# Patient Record
Sex: Male | Born: 1960 | Race: White | Hispanic: No | Marital: Married
Health system: Midwestern US, Academic
[De-identification: ages and names within clinical notes are randomized; demographics above are authoritative.]

---

## 2016-12-16 ENCOUNTER — Ambulatory Visit: Admit: 2016-12-16 | Discharge: 2016-12-17 | Payer: Commercial Managed Care - PPO

## 2016-12-16 ENCOUNTER — Encounter: Admit: 2016-12-16 | Discharge: 2016-12-16 | Payer: 59

## 2016-12-16 DIAGNOSIS — G629 Polyneuropathy, unspecified: ICD-10-CM

## 2016-12-16 DIAGNOSIS — I2510 Atherosclerotic heart disease of native coronary artery without angina pectoris: ICD-10-CM

## 2016-12-16 DIAGNOSIS — E785 Hyperlipidemia, unspecified: ICD-10-CM

## 2016-12-16 DIAGNOSIS — I10 Essential (primary) hypertension: Principal | ICD-10-CM

## 2016-12-16 MED ORDER — PREGABALIN 25 MG PO CAP
125 mg | ORAL_CAPSULE | Freq: Two times a day (BID) | ORAL | 5 refills | Status: AC
Start: 2016-12-16 — End: 2017-04-26

## 2016-12-16 NOTE — Progress Notes
Subjective:       History of Present Illness    INTERVAL HISTORY SINCE LAST VISIT:     Ricky Cordova 56 y.o. male is here today for follow up.      Onset: 10 years ago with numbness in the right thigh when walking on concrete then began to have pain in the feet.  This is bilateral.  Noted to be cold, tingling, burning sensation.  Pain is moderate.  Caffeine is a trigger for worsening pain. He thinks that the lyrica is still overall helpful. He does not feel that things have changed much. He can skip one dose and do ok, but if he skips an entire dose, the pain will be worse the next morning.   His back pain waxes and wanes.   He does report normal blood work with PCP recently.   FH: Maternal GF - diet controlled DM; Maternal GM - DM  Hx of DM: denies.   Hx of EtOH abuse: Occasional now. He notes when in his 77s, he drank heavily every night (10 years).  Hx of cancer/chemotherapy: denies      Medications tried:   lyrica 150mg  BID --> 75 mg twice daily 01/14/16, then back to 150mg /75mg  12/17  Cymbalta 30 mg daily --> constipation that was severe - and pain worsened  gabapentin (no help)  could not afford compounding cream  lidoderm patches not approved  EMLA cream -no help      Labs 2015: CBC, CMP, B12, folate, TSH, A1c, ANA, ESR, Mag - normal, will be scanned; MG panel neg, IFE neg, Lyme ab neg  EMG/NCS 09/26/13 by Dr. 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 No radiculopathy or myopathy noted.  MRI lumbar spine 11/15: At L4-5, there is mild-to-moderate disk desiccation with a diffuse broad-based posterior disk bulge.  There is 2 x 9 mm zone of hyperintensity transversely oriented within the posterior central aspect of the disk consistent with annular tear seen on axial T2 sliced 18 and sagittal T2 slice 7.  There is mild-to-moderate bilateral facet arthrosis with a tiny amount of fluid signal intensity within the facet joints bilaterally.  The disk bulge abuts the descending bilateral L5 nerve roots.  No significant obvious fracture.  There is mild-to-moderate narrowing of the thecal sac to 9 mm.  There is bilateral subarticular zone narrowing.  There is moderate-to-severe medial bilateral neural foraminal stenosis due to foraminal disk bulge and endplate osteophytic ridging.  ______________________________________________________________    Past Medical History:   Diagnosis Date   ??? Coronary artery disease    ??? Hypertension    ??? Neuropathy      Past Surgical History:   Procedure Laterality Date   ??? TONSILLECTOMY  1968   ??? BALLOON ANGIOPLASTY, ARTERY       Social History     Social History   ??? Marital status: Married     Spouse name: N/A   ??? Number of children: N/A   ??? Years of education: N/A     Occupational History   ??? Not on file.     Social History Main Topics   ??? Smoking status: Former Smoker     Packs/day: 2.00     Years: 10.00     Types: Cigarettes   ??? Smokeless tobacco: Current User     Types: Chew   ??? Alcohol use Yes      Comment: 15   ???  Drug use: No   ??? Sexual activity: Not on file     Other Topics Concern   ??? Not on file     Social History Narrative   ??? No narrative on file     Family History   Problem Relation Age of Onset   ??? Cancer Other    ??? Hypertension Other    ??? Thyroid Disease Other      Allergies:  No Known Allergies       Review of Systems   Constitutional: Negative.    HENT: Negative.    Eyes: Negative.    Respiratory: Negative.    Cardiovascular: Negative.    Gastrointestinal: Negative.    Genitourinary: Negative.    Musculoskeletal: Positive for arthralgias and back pain.   Skin: Negative.    Neurological: Positive for numbness.   Psychiatric/Behavioral: Negative.          Objective:         ??? aspirin EC 81 mg tablet Take 81 mg by mouth daily. Take with food.   ??? atorvastatin (LIPITOR) 20 mg tablet Take 20 mg by mouth daily. ??? CALCIUM CARBONATE/VITAMIN D3 (VITAMIN D-3 PO) Take 5,000 Int'l Units by mouth daily.   ??? FLAXSEED OIL (OMEGA 3 PO) Take  by mouth daily.   ??? folic acid 400 mcg tablet Take 400 mcg by mouth daily.   ??? L GASSERI/B BIFIDUM/B LONGUM (PHILLIPS' COLON HEALTH PO) Take  by mouth daily.   ??? lidocaine-prilocaine (EMLA) 2.5-2.5 % topical cream APPLY TO RIGHT FOOT TWICE DAILY TO PAINFUL AREA   ??? LYRICA 25 mg capsule TAKE 5 CAPSULES BY MOUTH TWO TIMES A DAY   ??? metoprolol XL (TOPROL XL) 25 mg extended release tablet Take 25 mg by mouth daily.   ??? olmesartan-hydrochlorothiazide (BENICAR HCT) 40-25 mg tablet Take 0.5 Tabs by mouth daily.   ??? OMEPRAZOLE (PRILOSEC PO) Take  by mouth.     Vitals:    12/16/16 0735   BP: 151/71   Pulse: 64   SpO2: 100%   Weight: (!) 161.5 kg (356 lb)   Height: 185.4 cm (73)     Body mass index is 46.97 kg/m???.     Physical Exam    General: alert, oriented x 3  Speech: normal, no dysarthria  Ext: no edema,  Cranial nerves:  III, IV, VI  extraocular muscles intact, no nystagmus, mild left nonfatiguable ptosis; V facial sensation intact; VII facial expression symmetric; VIII hearing intact to conversation; IX, X palate rise symmetric; XI sternocleidomastoid strength intact; XII tongue midline  Motor: (Right/Left)  Deltoid 5/5, Biceps 5/5, Triceps 5/5,  interossei 5/5, Hip Flexion 5/5, Knee ext 5/5, Knee flex 5/5, Ankle dorsiflexion 5/5, Ankle plantarflexion 5/5  Sensory: Normal to light touch. Proprioception intact at toes. Pinprick is decreased to just below knees, decreased to mid palm bilaterally; Vibration in seconds R/L toes 8/5  Reflexes: (Right/Left) Biceps 1/1, Triceps 1/1, Brachioradialis 1/1, Knee Jerks 1/1, Ankle Jerks tr/tr, Toes downgoing  Abnormal Movements: no tremors, no rigidity  Gait: normal based, good arm swing       Assessment and Plan:  Ricky Cordova 56 y.o. male is here today for evaluation of Peripheral neuropathy.  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   We did discuss that this could be related to his strong alcohol history. We discussed possible paraneoplastic panel in the future as well due to the current cryptogenic nature and mild autonomic symptoms.     In  terms of the left eye ptosis, this is non-fatigable, and he denies any double vision, blurry vision, weakness, trouble swallowing, or speaking. Currently, he has no other signs of myasthenia gravis or neuromuscular junction disorders on exam with neg MG panel, and this is stable.    1. Neuropathy        RECOMMENDATIONS:  -- Lab: consider paraneoplastic panel due to mild autonomic symptoms - monitor as no progression reported- discussed again today  -- If ptosis worsens or other symptoms develop, can repeat NCS with rep stim.  -- Continue lyrica 125mg  twice daily.   -- Consider amitriptyline next.  -- Discussed physical therapy and repeat MRI Lumbar spine w/o +/- epidural injections.      FOLLOWUP PLAN  Return in about 8 months (around 08/16/2017) for follow up neuropathy.  The patient is instructed to contact me if there are any concerns with the agreed plan.  Total time 18 minutes.  Estimated counseling time 10 minutes.  Counseled Ricky Cordova regarding neuropathy.

## 2017-03-09 ENCOUNTER — Encounter: Admit: 2017-03-09 | Discharge: 2017-03-09 | Payer: 59

## 2017-03-09 NOTE — Telephone Encounter
Patient reports that his feet are tingling so bad its almost hard to stand.  He denies any worsened burning sensations or numbness.  He states this has been going on for about 2 weeks now.  He states the tingling did stop for about 1-2 days then came back worse.    Dr. Ala Dach, per last visit note would consider amitriptyline next.  Please advise.

## 2017-03-09 NOTE — Telephone Encounter
Please see if he has had any worsening of back pain, incontinence, weakness, or it is only pain. - He will need to be seen if it is worsening.  We can do a trial of amitriptyline 10mg  bedtime x 2 weeks, then 20mg  bedtime thererafter. -- Start trial of amitriptyline, a tricyclic antidepressant. This is OFF LABEL for neuropathy, but often used with good efficacy. Please note common side effects include dry eyes, dry mouth, fatigue, rarely elevated heart rate. This can also sometimes cause urinary hesitancy, so if this develops, he needs to stop the medication.

## 2017-03-10 MED ORDER — AMITRIPTYLINE 10 MG PO TAB
ORAL_TABLET | 5 refills | Status: AC
Start: 2017-03-10 — End: 2017-08-16

## 2017-03-10 NOTE — Telephone Encounter
Patient denies any worsened back pain, incontinence, or weakness.  He reports it is is just a tingling pain in his feet.    Notified patient of amitriptyline trial including titration instructions and potential side effects, patient verbalized understanding.  Patient will contact clinic with any questions or intolerable side effects.

## 2017-03-17 ENCOUNTER — Encounter: Admit: 2017-03-17 | Discharge: 2017-03-17 | Payer: 59

## 2017-04-26 MED ORDER — PREGABALIN 150 MG PO CAP
150 mg | ORAL_CAPSULE | Freq: Two times a day (BID) | ORAL | 3 refills | Status: AC
Start: 2017-04-26 — End: 2017-08-31

## 2017-04-26 NOTE — Telephone Encounter
If he feels lyrica 150mg  BID is more helpful, then ok to refill x 3 months. Please remind him that this medication may cause weight gain, dizziness, swelling in legs, and rarely confusion. He should call with any adverse symptoms.   Please see if we can get a copy of his last labs including creatinine.

## 2017-04-26 NOTE — Telephone Encounter
Advised patient of possible SE's and to call with any adverse symptoms, patient voiced understanding.     Called in script to pharmacy. Requested labs from pcp.

## 2017-04-26 NOTE — Telephone Encounter
Patient states that the amitriptyline did not help his neuropathy and caused him to have severe dry eyes and mouth to where he was having a sore throat. He d/c'd amitriptyline and increased lyrica to 150mg  BID from 125mg  BID. Patient is requesting refills on lyrica 150mg  BID.

## 2017-08-16 ENCOUNTER — Encounter: Admit: 2017-08-16 | Discharge: 2017-08-16 | Payer: 59

## 2017-08-16 DIAGNOSIS — I2510 Atherosclerotic heart disease of native coronary artery without angina pectoris: ICD-10-CM

## 2017-08-16 DIAGNOSIS — G4733 Obstructive sleep apnea (adult) (pediatric): ICD-10-CM

## 2017-08-16 DIAGNOSIS — I10 Essential (primary) hypertension: Principal | ICD-10-CM

## 2017-08-16 DIAGNOSIS — G629 Polyneuropathy, unspecified: ICD-10-CM

## 2017-08-16 DIAGNOSIS — E785 Hyperlipidemia, unspecified: ICD-10-CM

## 2017-08-16 MED ORDER — VENLAFAXINE 37.5 MG PO CP24
37.5 mg | ORAL_CAPSULE | Freq: Every day | ORAL | 5 refills | Status: AC
Start: 2017-08-16 — End: 2018-01-17

## 2017-08-17 ENCOUNTER — Ambulatory Visit: Admit: 2017-08-16 | Discharge: 2017-08-17 | Payer: 59

## 2017-08-17 DIAGNOSIS — G8929 Other chronic pain: ICD-10-CM

## 2017-08-17 DIAGNOSIS — G629 Polyneuropathy, unspecified: Principal | ICD-10-CM

## 2017-08-17 DIAGNOSIS — M545 Low back pain: Secondary | ICD-10-CM

## 2017-08-23 ENCOUNTER — Encounter: Admit: 2017-08-23 | Discharge: 2017-08-23 | Payer: 59

## 2017-08-31 ENCOUNTER — Encounter: Admit: 2017-08-31 | Discharge: 2017-08-31 | Payer: 59

## 2017-08-31 MED ORDER — PREGABALIN 150 MG PO CAP
150 mg | ORAL_CAPSULE | Freq: Two times a day (BID) | ORAL | 3 refills | Status: AC
Start: 2017-08-31 — End: 2018-01-05

## 2017-09-10 ENCOUNTER — Encounter: Admit: 2017-09-10 | Discharge: 2017-09-11 | Payer: 59

## 2017-09-12 ENCOUNTER — Encounter: Admit: 2017-09-12 | Discharge: 2017-09-12 | Payer: 59

## 2017-09-12 DIAGNOSIS — G629 Polyneuropathy, unspecified: Principal | ICD-10-CM

## 2017-09-12 DIAGNOSIS — M545 Low back pain: ICD-10-CM

## 2017-09-12 DIAGNOSIS — M5416 Radiculopathy, lumbar region: Principal | ICD-10-CM

## 2017-09-28 ENCOUNTER — Encounter: Admit: 2017-09-28 | Discharge: 2017-09-28 | Payer: 59

## 2017-09-28 ENCOUNTER — Ambulatory Visit: Admit: 2017-09-28 | Discharge: 2017-09-29 | Payer: 59

## 2017-09-28 DIAGNOSIS — I10 Essential (primary) hypertension: Principal | ICD-10-CM

## 2017-09-28 DIAGNOSIS — G4733 Obstructive sleep apnea (adult) (pediatric): ICD-10-CM

## 2017-09-28 DIAGNOSIS — G629 Polyneuropathy, unspecified: Principal | ICD-10-CM

## 2017-09-28 DIAGNOSIS — E785 Hyperlipidemia, unspecified: ICD-10-CM

## 2017-09-28 DIAGNOSIS — I2510 Atherosclerotic heart disease of native coronary artery without angina pectoris: ICD-10-CM

## 2017-10-06 ENCOUNTER — Encounter: Admit: 2017-10-06 | Discharge: 2017-10-06 | Payer: 59

## 2017-10-06 DIAGNOSIS — M5416 Radiculopathy, lumbar region: Principal | ICD-10-CM

## 2017-10-11 ENCOUNTER — Encounter: Admit: 2017-10-11 | Discharge: 2017-10-11 | Payer: 59

## 2017-11-18 ENCOUNTER — Encounter: Admit: 2017-11-18 | Discharge: 2017-11-18 | Payer: 59

## 2018-01-05 ENCOUNTER — Encounter: Admit: 2018-01-05 | Discharge: 2018-01-05 | Payer: 59

## 2018-01-05 MED ORDER — LYRICA 150 MG PO CAP
ORAL_CAPSULE | Freq: Two times a day (BID) | 1 refills | Status: AC
Start: 2018-01-05 — End: 2018-01-17

## 2018-01-17 ENCOUNTER — Encounter: Admit: 2018-01-17 | Discharge: 2018-01-17 | Payer: 59

## 2018-01-17 ENCOUNTER — Ambulatory Visit: Admit: 2018-01-17 | Discharge: 2018-01-18 | Payer: 59

## 2018-01-17 DIAGNOSIS — G629 Polyneuropathy, unspecified: ICD-10-CM

## 2018-01-17 DIAGNOSIS — G4733 Obstructive sleep apnea (adult) (pediatric): ICD-10-CM

## 2018-01-17 DIAGNOSIS — I2510 Atherosclerotic heart disease of native coronary artery without angina pectoris: ICD-10-CM

## 2018-01-17 DIAGNOSIS — I10 Essential (primary) hypertension: Principal | ICD-10-CM

## 2018-01-17 DIAGNOSIS — E785 Hyperlipidemia, unspecified: ICD-10-CM

## 2018-01-17 MED ORDER — PREGABALIN 150 MG PO CAP
150 mg | ORAL_CAPSULE | Freq: Two times a day (BID) | ORAL | 5 refills | Status: AC
Start: 2018-01-17 — End: 2018-08-01

## 2018-01-18 DIAGNOSIS — M5416 Radiculopathy, lumbar region: Principal | ICD-10-CM

## 2018-01-18 DIAGNOSIS — G629 Polyneuropathy, unspecified: Secondary | ICD-10-CM

## 2018-01-18 DIAGNOSIS — M545 Low back pain: ICD-10-CM

## 2018-01-18 DIAGNOSIS — G8929 Other chronic pain: ICD-10-CM

## 2018-03-13 ENCOUNTER — Ambulatory Visit: Admit: 2018-03-13 | Discharge: 2018-03-14 | Payer: 59

## 2018-03-13 ENCOUNTER — Encounter: Admit: 2018-03-13 | Discharge: 2018-03-13 | Payer: 59

## 2018-03-13 DIAGNOSIS — I2510 Atherosclerotic heart disease of native coronary artery without angina pectoris: ICD-10-CM

## 2018-03-13 DIAGNOSIS — E785 Hyperlipidemia, unspecified: ICD-10-CM

## 2018-03-13 DIAGNOSIS — G4733 Obstructive sleep apnea (adult) (pediatric): ICD-10-CM

## 2018-03-13 DIAGNOSIS — I10 Essential (primary) hypertension: Principal | ICD-10-CM

## 2018-03-13 DIAGNOSIS — G629 Polyneuropathy, unspecified: ICD-10-CM

## 2018-03-14 DIAGNOSIS — M5416 Radiculopathy, lumbar region: Principal | ICD-10-CM

## 2018-03-14 DIAGNOSIS — M545 Low back pain: ICD-10-CM

## 2018-03-14 DIAGNOSIS — M5136 Other intervertebral disc degeneration, lumbar region: ICD-10-CM

## 2018-03-14 DIAGNOSIS — M5417 Radiculopathy, lumbosacral region: ICD-10-CM

## 2018-03-14 DIAGNOSIS — G8929 Other chronic pain: ICD-10-CM

## 2018-03-14 DIAGNOSIS — G629 Polyneuropathy, unspecified: ICD-10-CM

## 2018-03-15 ENCOUNTER — Ambulatory Visit: Admit: 2018-03-15 | Discharge: 2018-03-15 | Payer: 59

## 2018-03-15 ENCOUNTER — Encounter: Admit: 2018-03-15 | Discharge: 2018-03-15 | Payer: 59

## 2018-03-15 DIAGNOSIS — I2510 Atherosclerotic heart disease of native coronary artery without angina pectoris: ICD-10-CM

## 2018-03-15 DIAGNOSIS — M5417 Radiculopathy, lumbosacral region: ICD-10-CM

## 2018-03-15 DIAGNOSIS — R52 Pain, unspecified: Principal | ICD-10-CM

## 2018-03-15 DIAGNOSIS — G4733 Obstructive sleep apnea (adult) (pediatric): ICD-10-CM

## 2018-03-15 DIAGNOSIS — E785 Hyperlipidemia, unspecified: ICD-10-CM

## 2018-03-15 DIAGNOSIS — M5136 Other intervertebral disc degeneration, lumbar region: Principal | ICD-10-CM

## 2018-03-15 DIAGNOSIS — I10 Essential (primary) hypertension: Principal | ICD-10-CM

## 2018-03-15 DIAGNOSIS — G629 Polyneuropathy, unspecified: ICD-10-CM

## 2018-03-15 MED ORDER — OTHER MEDICATION
1 | Freq: Once | EPIDURAL | 0 refills | Status: CP
Start: 2018-03-15 — End: ?

## 2018-03-15 MED ORDER — LIDOCAINE (PF) 10 MG/ML (1 %) IJ SOLN
2 mL | Freq: Once | INTRAMUSCULAR | 0 refills | Status: CP
Start: 2018-03-15 — End: ?

## 2018-03-15 MED ORDER — METHYLPREDNISOLONE ACETATE 40 MG/ML IJ SUSP
80 mg | Freq: Once | INTRAMUSCULAR | 0 refills | Status: CP
Start: 2018-03-15 — End: ?

## 2018-03-16 ENCOUNTER — Ambulatory Visit: Admit: 2018-03-15 | Discharge: 2018-03-16 | Payer: 59

## 2018-03-16 DIAGNOSIS — R52 Pain, unspecified: Principal | ICD-10-CM

## 2018-05-17 ENCOUNTER — Ambulatory Visit: Admit: 2018-05-17 | Discharge: 2018-05-18 | Payer: 59

## 2018-05-17 ENCOUNTER — Ambulatory Visit: Admit: 2018-05-17 | Discharge: 2018-05-17 | Payer: 59

## 2018-05-17 ENCOUNTER — Encounter: Admit: 2018-05-17 | Discharge: 2018-05-17 | Payer: 59

## 2018-05-17 DIAGNOSIS — M5136 Other intervertebral disc degeneration, lumbar region: Principal | ICD-10-CM

## 2018-05-17 DIAGNOSIS — G4733 Obstructive sleep apnea (adult) (pediatric): ICD-10-CM

## 2018-05-17 DIAGNOSIS — M5417 Radiculopathy, lumbosacral region: ICD-10-CM

## 2018-05-17 DIAGNOSIS — R52 Pain, unspecified: ICD-10-CM

## 2018-05-17 DIAGNOSIS — G629 Polyneuropathy, unspecified: ICD-10-CM

## 2018-05-17 DIAGNOSIS — I10 Essential (primary) hypertension: Principal | ICD-10-CM

## 2018-05-17 DIAGNOSIS — E785 Hyperlipidemia, unspecified: ICD-10-CM

## 2018-05-17 DIAGNOSIS — I2510 Atherosclerotic heart disease of native coronary artery without angina pectoris: ICD-10-CM

## 2018-05-17 MED ORDER — LIDOCAINE (PF) 10 MG/ML (1 %) IJ SOLN
2 mL | Freq: Once | INTRAMUSCULAR | 0 refills | Status: CP
Start: 2018-05-17 — End: ?

## 2018-05-17 MED ORDER — METHYLPREDNISOLONE ACETATE 40 MG/ML IJ SUSP
80 mg | Freq: Once | INTRAMUSCULAR | 0 refills | Status: CP
Start: 2018-05-17 — End: ?

## 2018-05-17 MED ORDER — IOHEXOL 300 MG IODINE/ML IV SOLN
2 mL | Freq: Once | 0 refills | Status: CP
Start: 2018-05-17 — End: ?

## 2018-06-15 ENCOUNTER — Encounter: Admit: 2018-06-15 | Discharge: 2018-06-15 | Payer: 59

## 2018-06-15 ENCOUNTER — Ambulatory Visit: Admit: 2018-06-15 | Discharge: 2018-06-16 | Payer: 59

## 2018-06-15 DIAGNOSIS — G629 Polyneuropathy, unspecified: Secondary | ICD-10-CM

## 2018-06-15 DIAGNOSIS — I2510 Atherosclerotic heart disease of native coronary artery without angina pectoris: Secondary | ICD-10-CM

## 2018-06-15 DIAGNOSIS — E785 Hyperlipidemia, unspecified: Secondary | ICD-10-CM

## 2018-06-15 DIAGNOSIS — G4733 Obstructive sleep apnea (adult) (pediatric): Secondary | ICD-10-CM

## 2018-06-15 DIAGNOSIS — I10 Essential (primary) hypertension: Secondary | ICD-10-CM

## 2018-06-15 MED ORDER — NORTRIPTYLINE 25 MG PO CAP
50 mg | ORAL_CAPSULE | Freq: Every evening | ORAL | 1 refills | Status: AC
Start: 2018-06-15 — End: 2018-08-01

## 2018-06-16 DIAGNOSIS — M5136 Other intervertebral disc degeneration, lumbar region: Secondary | ICD-10-CM

## 2018-06-16 DIAGNOSIS — M5417 Radiculopathy, lumbosacral region: Secondary | ICD-10-CM

## 2018-06-16 DIAGNOSIS — G629 Polyneuropathy, unspecified: Secondary | ICD-10-CM

## 2018-07-26 ENCOUNTER — Encounter: Admit: 2018-07-26 | Discharge: 2018-07-26 | Payer: 59

## 2018-08-01 ENCOUNTER — Ambulatory Visit: Admit: 2018-08-01 | Discharge: 2018-08-02 | Payer: 59

## 2018-08-01 ENCOUNTER — Encounter: Admit: 2018-08-01 | Discharge: 2018-08-01 | Payer: 59

## 2018-08-01 DIAGNOSIS — I10 Essential (primary) hypertension: Principal | ICD-10-CM

## 2018-08-01 DIAGNOSIS — I2510 Atherosclerotic heart disease of native coronary artery without angina pectoris: ICD-10-CM

## 2018-08-01 DIAGNOSIS — G629 Polyneuropathy, unspecified: ICD-10-CM

## 2018-08-01 DIAGNOSIS — E785 Hyperlipidemia, unspecified: ICD-10-CM

## 2018-08-01 DIAGNOSIS — G4733 Obstructive sleep apnea (adult) (pediatric): ICD-10-CM

## 2018-08-01 MED ORDER — VENLAFAXINE 37.5 MG PO CP24
37.5 mg | ORAL_CAPSULE | Freq: Every day | ORAL | 0 refills | Status: AC
Start: 2018-08-01 — End: 2018-10-09

## 2018-08-01 MED ORDER — PREGABALIN 150 MG PO CAP
150 mg | ORAL_CAPSULE | Freq: Two times a day (BID) | ORAL | 5 refills | Status: AC
Start: 2018-08-01 — End: 2018-09-29

## 2018-08-01 NOTE — Progress Notes
Cymbalta 30 mg daily --> constipation that was severe - and pain worsened  Gabapentin (no help)  Could not afford compounding cream  Lidoderm patches not approved  EMLA cream -no help  Amitriptyline - sore throat/mucosal injury/dry  Nortriptyline 25mg  - did not take it due to response with Amitriptyline  Effexor 37.5mg  daily  TFESI x 2 - made pain worse possibly        EMG/NCS 09/29/17 with Dr. Seleta Rhymes: EMG/NCS showed mild worsening compared to 2015 in terms of neuropathy. I spoke with Dr. Seleta Rhymes, and although it is noted he has chronic and active changes in gastroc, this may be due to his back issues. We will watch this closely in clinic.  MRI lumbar spine w/o 09/10/17: Performed at [facility] imaging impression: Mild progression of degenerative disc disease at L1 to compared with prior study.  Progression of right foraminal disc bulge at L3-4 causing right-sided neural foraminal narrowing and effacement of the exiting right L3 nerve root.  There is been interval progression of right lateral recess narrowing at this level also compared to 2015.  Mild progression of degenerative disc disease and facet arthrosis at L4-5.  There is persistent broad-based central to right paracentric disc protrusion and annular tear.  The right lateral recess narrowing has progressed. Central canal 8.66mm. Effacement of the ventral surface of the right L5 nerve root in the lateral recess and bilateral neural foraminal narrowing with effacement of the undersurface of both exiting L4 nerve roots.   Labs 2015: B12, folate, TSH, A1c, ANA, ESR - normal; MG panel neg, IFE neg, Lyme ab neg  EMG/NCS 09/26/13 by Dr. 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 No radiculopathy or myopathy noted.    ______________________________________________________________    Medical History:   Diagnosis Date   ??? Coronary artery disease ??? Hyperlipidemia    ??? Hypertension    ??? Neuropathy    ??? OSA on CPAP      Surgical History:   Procedure Laterality Date   ??? TONSILLECTOMY  1968   ??? BALLOON ANGIOPLASTY, ARTERY       Social History     Socioeconomic History   ??? Marital status: Married     Spouse name: Not on file   ??? Number of children: Not on file   ??? Years of education: Not on file   ??? Highest education level: Not on file   Occupational History   ??? Not on file   Tobacco Use   ??? Smoking status: Former Smoker     Packs/day: 2.00     Years: 10.00     Pack years: 20.00     Types: Cigarettes   ??? Smokeless tobacco: Current User     Types: Chew   Substance and Sexual Activity   ??? Alcohol use: Yes     Comment: 15   ??? Drug use: No   ??? Sexual activity: Not on file   Other Topics Concern   ??? Not on file   Social History Narrative   ??? Not on file     Family History   Problem Relation Age of Onset   ??? Cancer Other    ??? Hypertension Other    ??? Thyroid Disease Other      Allergies:  No Known Allergies       Review of Systems   Constitutional: Negative.    HENT: Negative.    Eyes: Negative.  Respiratory: Negative.    Cardiovascular: Negative.    Gastrointestinal: Negative.    Genitourinary: Negative.    Musculoskeletal: Positive for arthralgias and back pain.   Skin: Negative.    Neurological: Positive for numbness.   Psychiatric/Behavioral: Negative.          Objective:         ??? aspirin EC 81 mg tablet Take 81 mg by mouth daily. Take with food.   ??? atorvastatin (LIPITOR) 20 mg tablet Take 20 mg by mouth daily.   ??? CALCIUM CARBONATE/VITAMIN D3 (VITAMIN D-3 PO) Take 5,000 Int'l Units by mouth daily.   ??? doxazosin (CARDURA) 2 mg tablet    ??? folic acid 400 mcg tablet Take 400 mcg by mouth daily.   ??? hydroCHLOROthiazide (HYDRODIURIL) 25 mg tablet    ??? L GASSERI/B BIFIDUM/B LONGUM (PHILLIPS' COLON HEALTH PO) Take  by mouth daily.   ??? metoprolol XL (TOPROL XL) 25 mg extended release tablet Take 12.5 mg by mouth daily. 2. Lumbar radiculopathy       RECOMMENDATIONS:  -- Continue lyrica 150mg  twice daily.   -- Referral to Neuromuscular clinic for worsening neuropathic symptoms.    FOLLOWUP PLAN  Plan for f/u in neuromuscular clinic.   The patient is instructed to contact me if there are any concerns with the agreed plan.  Total time 30 minutes.  Estimated counseling time 20 minutes.  Counseled Mr. Zadeh regarding neuropathic pain, back pain, medications, [facility] clinic referral for continuation of care.

## 2018-08-02 DIAGNOSIS — M5416 Radiculopathy, lumbar region: ICD-10-CM

## 2018-08-02 DIAGNOSIS — G629 Polyneuropathy, unspecified: Principal | ICD-10-CM

## 2018-09-29 ENCOUNTER — Encounter: Admit: 2018-09-29 | Discharge: 2018-09-29 | Payer: 59

## 2018-09-29 MED ORDER — PREGABALIN 150 MG PO CAP
150 mg | ORAL_CAPSULE | Freq: Two times a day (BID) | ORAL | 0 refills | Status: DC
Start: 2018-09-29 — End: 2019-01-05

## 2018-09-29 NOTE — Telephone Encounter
Patient states that his insurance is now requiring that he have scripts for 90 day supplies sent to [facility] mail order.  He requests that a script for Lyrica be sent.    Dr. Ala Dach, okay to send one 90 day supply?  Patient sees [facility] clinic at the end of the month.

## 2018-10-09 ENCOUNTER — Encounter: Admit: 2018-10-09 | Discharge: 2018-10-09 | Payer: 59

## 2018-10-09 MED ORDER — VENLAFAXINE 37.5 MG PO CP24
37.5 mg | ORAL_CAPSULE | Freq: Every day | ORAL | 0 refills | Status: DC
Start: 2018-10-09 — End: 2018-10-18

## 2018-10-18 ENCOUNTER — Encounter: Admit: 2018-10-18 | Discharge: 2018-10-18 | Payer: 59

## 2018-10-18 ENCOUNTER — Ambulatory Visit: Admit: 2018-10-18 | Discharge: 2018-10-19 | Payer: 59

## 2018-10-18 DIAGNOSIS — M109 Gout, unspecified: ICD-10-CM

## 2018-10-18 DIAGNOSIS — G629 Polyneuropathy, unspecified: Principal | ICD-10-CM

## 2018-10-18 DIAGNOSIS — I2510 Atherosclerotic heart disease of native coronary artery without angina pectoris: ICD-10-CM

## 2018-10-18 DIAGNOSIS — I10 Essential (primary) hypertension: Principal | ICD-10-CM

## 2018-10-18 DIAGNOSIS — M5416 Radiculopathy, lumbar region: Secondary | ICD-10-CM

## 2018-10-18 DIAGNOSIS — E785 Hyperlipidemia, unspecified: ICD-10-CM

## 2018-10-18 DIAGNOSIS — G4733 Obstructive sleep apnea (adult) (pediatric): ICD-10-CM

## 2018-10-18 LAB — VITAMIN B12: Lab: 401 pg/mL — ABNORMAL LOW (ref 180–914)

## 2018-10-18 LAB — HEMOGLOBIN A1C: Lab: 5.4 % — ABNORMAL LOW (ref 4.0–6.0)

## 2018-10-18 MED ORDER — VENLAFAXINE 75 MG PO CP24
75 mg | ORAL_CAPSULE | Freq: Every day | ORAL | 5 refills | Status: DC
Start: 2018-10-18 — End: 2019-04-06

## 2018-10-18 NOTE — Progress Notes
Date of Service: 10/18/2018    Subjective:             Ricky Cordova is a 58 y.o. male.    History of Present Illness  [name] is a 58 y.o male with long standing neuropathy, lumbar radiculoapthy who presents for evaluation of worsening neuropathic pain, and was referred by Dr. Ala Dach.   Onset: 2008,  Symptoms: tingling and numbness sensation in his feet, soles as well as dorsum of his feet b/l and Right thigh(started way before his feet). He reports feels like pins+needles sensation. He reports pain can be very severe certain days. Pain is worst and mainly at night, and a lot better during the day. He reports pain has slowly progressed over the past several months.   Triggers: taking off his boots sometimes, caffeine    Alleviating factors: compression stockings, helped for a bit but not longer beneficial.   Also reports chronic back pain, and reports it is not bothersome, and he had injections for that in the past. No radiating or radicular symptoms with his back pain.No bowel or bladder symptoms.    He has tried multiple neuropathic medications in the past including but not limited to; Cymbalta, gabapentin, TCAs, Effexor, lidocaine,topica creams without much benefit. He is still on lyrica 150 mg bid and thinks this is helping along with Effexor is helping. He has seen pain management and PT, and was not able to keep up due to work.  Denies any falls.     Denies any history of diabetes, but has hypertension and HLD.   No family history of neuropathy.   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  Hx of EtOH abuse; two 30 packs every weekend and was a heavy drinker since age 60, and he quit about 2 years ago   Cocaine use, and marijuana on rare occasions.   Former smoker; 1-2 pack a day for 10 years but if he was drinking he could smoke 3-4 packs a night. He quit 1991-1992.   ???  ???  Chart reviewed:   Medications tried: Lyrica 150mg  BID --> 75 mg twice daily 01/14/16, then back to 150mg /75mg  12/17  Cymbalta 30 mg daily --> constipation that was severe - and pain worsened  Gabapentin (no help)  Could not afford compounding cream  Lidoderm patches not approved  EMLA cream -no help  Amitriptyline - sore throat/mucosal injury/dry  Nortriptyline 25mg  - did not take it due to response with Amitriptyline  Effexor 37.5mg  daily  TFESI x 2 - made pain worse possibly      Previous workup reviewed as documented in Dr. Marily Lente last clinic note:  EMG/NCS 09/29/17 with Dr. Seleta Rhymes: EMG/NCS showed mild worsening compared to 2015 in terms of neuropathy. I spoke with Dr. Seleta Rhymes, and although it is noted he has chronic and active changes in gastroc, this may be due to his back issues. We will watch this closely in clinic.  MRI lumbar spine w/o 09/10/17: Performed at [facility] imaging impression: Mild progression of degenerative disc disease at L1 to compared with prior study. ???Progression of right foraminal disc bulge at L3-4 causing right-sided neural foraminal narrowing and effacement of the exiting right L3 nerve root. ???There is been interval progression of right lateral recess narrowing at this level also compared to 2015. ???Mild progression of degenerative disc disease and facet arthrosis at L4-5. ???There is persistent broad-based central to right  paracentric disc protrusion and annular tear. ???The right lateral recess narrowing has progressed. Central canal 8.21mm. Effacement of the ventral surface of the right L5 nerve root in the lateral recess and bilateral neural foraminal narrowing with effacement of the undersurface of both exiting L4 nerve roots.   Labs 2015: B12, folate, TSH, A1c, ANA, ESR - normal; MG panel neg, IFE neg, Lyme ab neg  EMG/NCS 09/26/13 by Dr. 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 No radiculopathy or myopathy noted. Speech: Normal fluency, comprehension, articulation, repetition and naming     Cranial Nerves:   CN II, III, IV, VI: visual fields intact, pupils 4 mm, round and reactive to light, extraocular movements intact with no saccadic intrusions, convergence and accommodation are normal, no nystagmus, no internuclear ophthalmoplegia. Fundoscopic exam normal with no evidence of papilledema  CN V: normal facial sensation in ophthalmic, maxillary, and mandibular dermatomes, muscles of mastication 5/5 bilaterally   CN VII: no facial droop, very subtle partial ptosis of L eye(since high school, not new). symmetric eye closure and eyebrow elevation symmetric/synchronous, Orbicularis oculi and Orbicularis Oris strength is 5/5   CN VIII: hearing grossly intact bilaterally to finger rub   CN IX, X: symmetric palatal elevation   CN XI: trapezii and sternocleidomastoids 5/5 bilaterally   CN XII: tongue midline, 5/5 strength bilaterally, no fasciculations     Motor: no bradykinesia, normal tone, normal bulk, no atrophy/fasciculations, no resting, postural, or action tremor   Muscle     Neck flexors  5    Neck extensors  5      Upper Extremity Muscle  Right  Left  Lower Extremity Muscle  Right  Left    Shoulder abductors  5  5  Hip flexors  5  5    Elbow flexors  5  5  Hip extensor  5 5   Elbow extensors  5  5  Hip abductors  5 5   Wrist flexors  5  5  Hip adductors  5 5   Wrist extensors  5  5  Knee flexors  5  5    Finger flexors  5  5  Knee extensors  5  5    Finger extensors  5  5  Ankle dorsiflexors  5  5    Finger abductors  5  5  Ankle plantar flexors  5  5    Thumb abductors  5  5  Ankle inversion  5 5      Ankle eversion  5 5      Toe flexor  4 4      Toe extensors  4 4     Sensory: Intact to light touch intact throughout. Pin prick decrease up to ankle on the right, but overall decrease on LE compared to UE. Vibration decrease midfoot on the left, and >8 secs on the right. Proprioception intact and temperature intact bilaterally.     Reflexes:   Reflex  Right  Left    Triceps  2/4  2/4    Biceps  2/4  2/4    Brachioradialis  2/4  2/4    Patella  2/4  2/4    Ankle  2-/4 2-/4   Plantar  downgoing  downgoing      Coordination: normal finger to nose, heel to shin, rapid alternating movements   Gait: normal-based, good arm swing, no flexor posturing, heel walk,  toe walk, tandem gait were normal.   Romberg: absent.         Assessment and Plan:  [name] is a pleasant 58 y.o male with long standing history of peripheral neuropathy, lumbar radiculoapthy who presented for evaluation of worsening neuropathic pain.  He has tried and failed multiple medications over the past several years and currently on Lyrica and Effexor with moderate benefit.     Impression:  #. 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 We will screen for other metabolic causes of neuropathy including b12, MMA, repeat immunofixation, HgA1c. His BMI is 43, and we discussed diet modification and exercise as tolerated and he was agreeable to this.     Plan:  Increase Effexor to 75 mg daily from 37.5 mg daily  Continue Lyrica 150 mg twice a day  Low dose of nortriptyline (10 mg qhs which can be increased gradually to 30 mg qhs) if needed in the future or if BP increases with higher doses of Effexor (patient was asked to monitor for that)   B12, MMA, B1, HgA1c, and repeat serum immunofixation labs today   Encouraged diet modification and exercise regimen for weight lost as tolerated.       RTC:  3 mo      Octaviano Glow, MD  Neuro PGY-3  P [phone]      Patient seen and examined with Dr. Ruel Favors.     ATTESTATION    I personally performed the key portions of the E/M visit, discussed case with resident and concur with resident documentation of history, physical exam, assessment, and treatment plan unless otherwise noted.        Total time 40 minutes.  More than 50% of the total time was used for counseling.  Counseled patient regarding recommended approach to his neuropathy        Retta Diones, MD  Assistant Professor  Neurology/Neuromuscular Medicine            Staff name:  Retta Diones, MD Date:  10/19/2018

## 2018-10-19 LAB — IMMUNOFIXATION, SERUM (IFES)

## 2018-10-20 LAB — VITAMIN B1 (THIAMINE) WHOLE BLD: Lab: 168 FL (ref 80–100)

## 2018-10-20 LAB — METHYLMALONIC ACID QUANT: Lab: 0.2 %

## 2018-12-29 ENCOUNTER — Encounter: Admit: 2018-12-29 | Discharge: 2018-12-29

## 2019-01-01 ENCOUNTER — Encounter: Admit: 2019-01-01 | Discharge: 2019-01-01

## 2019-01-01 MED ORDER — PREGABALIN 150 MG PO CAP
ORAL_CAPSULE | Freq: Two times a day (BID) | 0 refills
Start: 2019-01-01 — End: ?

## 2019-01-05 ENCOUNTER — Encounter: Admit: 2019-01-05 | Discharge: 2019-01-05

## 2019-01-05 MED ORDER — PREGABALIN 150 MG PO CAP
150 mg | ORAL_CAPSULE | Freq: Two times a day (BID) | ORAL | 1 refills | Status: DC
Start: 2019-01-05 — End: 2019-08-08

## 2019-01-05 NOTE — Telephone Encounter
Recieved lyrica refill request for Mr. Olmeda. LOV 10/18/2018, medication included in plan of care set forth by Dr. Loistine Simas, pt no longer follows with Dr. Marijean Bravo. Medication phoned into pharmacy. Dr. Loistine Simas to [place].

## 2019-04-06 ENCOUNTER — Encounter: Admit: 2019-04-06 | Discharge: 2019-04-06 | Payer: Commercial Managed Care - HMO

## 2019-04-06 ENCOUNTER — Ambulatory Visit: Admit: 2019-04-06 | Discharge: 2019-04-06 | Payer: Commercial Managed Care - HMO

## 2019-04-06 DIAGNOSIS — G4733 Obstructive sleep apnea (adult) (pediatric): Secondary | ICD-10-CM

## 2019-04-06 DIAGNOSIS — G629 Polyneuropathy, unspecified: Secondary | ICD-10-CM

## 2019-04-06 DIAGNOSIS — I10 Essential (primary) hypertension: Secondary | ICD-10-CM

## 2019-04-06 DIAGNOSIS — E785 Hyperlipidemia, unspecified: Secondary | ICD-10-CM

## 2019-04-06 DIAGNOSIS — I2510 Atherosclerotic heart disease of native coronary artery without angina pectoris: Secondary | ICD-10-CM

## 2019-04-06 DIAGNOSIS — M109 Gout, unspecified: Secondary | ICD-10-CM

## 2019-04-06 MED ORDER — VENLAFAXINE 100 MG PO TAB
100 mg | ORAL_TABLET | Freq: Every day | ORAL | 3 refills | Status: DC
Start: 2019-04-06 — End: 2020-01-07

## 2019-04-06 NOTE — Progress Notes
Telephone Visit Note  Patient not seen in clinic due to current COVID 19 pandemic.    Obtained patient's verbal consent to provide this telehealth visit due to the [facility].    Visit Start Time 2:45 PM Visit End Time 3:00 PM.    Date of Service: 04/06/2019    Subjective:      Ricky Cordova is a 58 y.o. male    History of Present Illness   Ricky Cordova is a 58 y.o. male with peripheral neuropathy who is seen via telehealth for f/u.  He is doing ok from neuropathy standpoint, but reports intermittent tingling/numbness around his R big toe mainly. He was driving per to our conversation.   Denies any worsening neuropathy symptoms since last seen in 09/2018. Denies any tingling/numbness in his fingers.   Currently on lyrica 150 mg bid and Effexor 75 mg daily and thinks this is helping, tolerating well.          Review of Systems   Constitutional: Negative.    Eyes: Negative.    Neurological: Positive for tingling.       Objective:  ? aspirin EC 81 mg tablet Take 81 mg by mouth daily. Take with food.   ? atorvastatin (LIPITOR) 20 mg tablet Take 20 mg by mouth daily.   ? CALCIUM CARBONATE/VITAMIN D3 (VITAMIN D-3 PO) Take 5,000 Int'l Units by mouth daily.   ? cholecalciferol (VITAMIN D-3) 125 mcg (5,000 unit) tablet Take 5,000 Units by mouth daily.   ? docusate (COLACE) 100 mg capsule Take 100 mg by mouth six times weekly.   ? doxazosin (CARDURA) 2 mg tablet    ? folic acid 400 mcg tablet Take 400 mcg by mouth daily.   ? hydroCHLOROthiazide (HYDRODIURIL) 25 mg tablet    ? L GASSERI/B BIFIDUM/B LONGUM (PHILLIPS' COLON HEALTH PO) Take  by mouth daily.   ? metoprolol XL (TOPROL XL) 25 mg extended release tablet Take 25 mg by mouth daily.   ? mv-mins-folic-lycopene-ginkgo (ONE-A-DAY MEN 50 PLUS (GINKGO)) 400-300-120 mcg-mcg-mg tab Take  by mouth.   ? OMEPRAZOLE (PRILOSEC PO) Take  by mouth.   ? pregabalin (LYRICA) 150 mg capsule Take one capsule by mouth twice daily. There were no vitals filed for this visit.  There is no height or weight on file to calculate BMI.      Physical Exam  This was a telephone encounter due to COVID-19 pandemic, therefore detail full examination was not performed.     Assessment and Plan:  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  He has tried and failed multiple medications over the past several years and currently on Lyrica and Effexor with moderate benefit. He reported intermittement worsening neuropathic pain in his R foot mainly around big toe. We discussed increasing his Effexor to 100 mg daily and he was agreeable to do.   ?  Extensive workup done in the past: unrevealing.     Impression:  #. 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 We will screen for other metabolic causes negative  -   [id number], MMA, repeat immunofixation, HgA1c all normal. Again, discussed diet modification and exercise as tolerated.      ?  Plan:  Increase Effexor to 100 mg daily from 75 mg daily   Continue Lyrica 150 mg twice a day  Encouraged diet modification and exercise regimen for weight lost as tolerated.   ?  Patient discussed with Dr. Genevie Cheshire.          Octaviano Glow, MD  Neuro PGY-4   Pager [phone]

## 2019-08-07 ENCOUNTER — Encounter: Admit: 2019-08-07 | Discharge: 2019-08-07 | Payer: Commercial Managed Care - HMO

## 2019-08-07 NOTE — Telephone Encounter
Lyrica required prior authorization.  [employer] for prior authorization.  Lyrica 150 mg capsule 30 day supply approved for 12 months starting 08/07/19. Authorization approval# [id number].      40 min total time spent on call.

## 2019-08-08 ENCOUNTER — Encounter: Admit: 2019-08-08 | Discharge: 2019-08-08 | Payer: Commercial Managed Care - HMO

## 2019-08-08 MED ORDER — PREGABALIN 150 MG PO CAP
150 mg | ORAL_CAPSULE | Freq: Two times a day (BID) | ORAL | 1 refills | Status: DC
Start: 2019-08-08 — End: 2019-10-02

## 2019-08-08 NOTE — Telephone Encounter
Notified patient of approval for Lyrica.  Remainder of prescription is currently under the [facility], which will not be able to provide patient with this medication before he is out.  [facility] [place], [place].  LOV: 10/18/18 with medication included in POC.  Further refills will need to be directed to the resident clinic as patient has established and will continue to follow up in resident clinic.     Lyrica 150 mg PO BID; QTY 60, R-1  Called into [facility], confirmed $10 for 30 day supply.     Called [facility]- cancelled Lyrica script.

## 2019-10-02 ENCOUNTER — Encounter: Admit: 2019-10-02 | Discharge: 2019-10-02 | Payer: Commercial Managed Care - HMO

## 2019-10-02 MED ORDER — PREGABALIN 150 MG PO CAP
150 mg | ORAL_CAPSULE | Freq: Two times a day (BID) | ORAL | 1 refills | Status: DC
Start: 2019-10-02 — End: 2019-10-05

## 2019-10-05 ENCOUNTER — Ambulatory Visit: Admit: 2019-10-05 | Discharge: 2019-10-06 | Payer: 59

## 2019-10-05 DIAGNOSIS — G629 Polyneuropathy, unspecified: Secondary | ICD-10-CM

## 2019-10-05 MED ORDER — PREGABALIN 150 MG PO CAP
150 mg | ORAL_CAPSULE | Freq: Two times a day (BID) | ORAL | 3 refills | Status: DC
Start: 2019-10-05 — End: 2019-12-10

## 2019-10-05 NOTE — Progress Notes
Date of Service: 10/05/2019    Subjective:             Zyaire Mccleod is a 59 y.o. male.    History of Present Illness  Darran Gabay is a 59 y.o. male with peripheral neuropathy who is seen via telehealth for f/u.    Events since last visit:04/2019  Neuropathy: doing really good and tingling/numbness has improved. He received two doses of moderna vaccination and tolerated well.   Currently on lyrica 150 mg bid and Effexor 100 mg daily, tolerating well. Denies any side effects. He will like to remain on his current meds, no changes.        Review of Systems   All other systems reviewed and are negative.     Review of Systems   All 12 systems otherwise negative.          Objective:         ? aspirin EC 81 mg tablet Take 81 mg by mouth daily. Take with food.   ? atorvastatin (LIPITOR) 20 mg tablet Take 20 mg by mouth daily.   ? CALCIUM CARBONATE/VITAMIN D3 (VITAMIN D-3 PO) Take 5,000 Int'l Units by mouth daily.   ? cholecalciferol (VITAMIN D-3) 125 mcg (5,000 unit) tablet Take 5,000 Units by mouth daily.   ? docusate (COLACE) 100 mg capsule Take 100 mg by mouth six times weekly.   ? doxazosin (CARDURA) 2 mg tablet    ? folic acid 400 mcg tablet Take 400 mcg by mouth daily.   ? hydroCHLOROthiazide (HYDRODIURIL) 25 mg tablet    ? L GASSERI/B BIFIDUM/B LONGUM (PHILLIPS' COLON HEALTH PO) Take  by mouth daily.   ? metoprolol XL (TOPROL XL) 25 mg extended release tablet Take 25 mg by mouth daily.   ? mv-mins-folic-lycopene-ginkgo (ONE-A-DAY MEN 50 PLUS (GINKGO)) 400-300-120 mcg-mcg-mg tab Take  by mouth.   ? OMEPRAZOLE (PRILOSEC PO) Take  by mouth.   ? pregabalin (LYRICA) 150 mg capsule Take one capsule by mouth twice daily.   ? venlafaxine (EFFEXOR) 100 mg tablet Take one tablet by mouth daily. Take with food.  Indications: neuropathic pain     There were no vitals filed for this visit.  There is no height or weight on file to calculate BMI.     Physical Exam  Neurological:  Mental status: Patient is alert and oriented to time, place, person and situation. Pt sitting in his car.   Speech: Fluent, with normal naming, comprehension, articulation and repetition  CN II-XII: EOMI, facial sensation intact. Symmetrical facial movement. Hearing grossly intact.   Motor: (R/L) b/l UE/LE 5/5. Normal tone and bulk. Moves upper extremities spontaneously.   Sensory: detail sensory not tested due to tele-heath visit.   Coordination/ fine movement: no gross ataxia noted.   Gait: not tested           Assessment and Plan:  Mr. 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 He is doing well on the increase dose of Effexor 100 mg last visit and Lyrica 150 mg bid.   ?  Impression:  #. Peripheral neuropathy-stable: suspect secondary to prior chronic alcohol abuse.?  -   [id number], MMA, repeat immunofixation, HgA1c all normal. Again, discussed diet modification and exercise as tolerated.    ?  ?  Plan:  Continue Effexor to 100 mg daily?from 75 mg daily   Continue Lyrica 150 mg twice a day(refills sent)  Encouraged?diet modification and  exercise regimen for?weight lost as tolerated.   ?  ?  Patient discussed with Dr. Genevie Cheshire.     ?  Encounter duration: 15 mins.              Agustin Cree, MD  Neurology PGY-4

## 2019-12-07 ENCOUNTER — Encounter: Admit: 2019-12-07 | Discharge: 2019-12-07 | Payer: 59

## 2019-12-08 ENCOUNTER — Encounter: Admit: 2019-12-08 | Discharge: 2019-12-08 | Payer: 59

## 2019-12-08 MED ORDER — PREGABALIN 150 MG PO CAP
150 mg | ORAL_CAPSULE | Freq: Two times a day (BID) | ORAL | 0 refills | Status: DC
Start: 2019-12-08 — End: 2019-12-10

## 2019-12-10 MED ORDER — PREGABALIN 150 MG PO CAP
ORAL_CAPSULE | Freq: Two times a day (BID) | 3 refills | Status: AC
Start: 2019-12-10 — End: ?

## 2020-01-03 ENCOUNTER — Encounter: Admit: 2020-01-03 | Discharge: 2020-01-03 | Payer: 59

## 2020-01-07 MED ORDER — VENLAFAXINE 100 MG PO TAB
100 mg | ORAL_TABLET | Freq: Every day | ORAL | 3 refills | Status: AC
Start: 2020-01-07 — End: ?

## 2020-03-21 ENCOUNTER — Encounter: Admit: 2020-03-21 | Discharge: 2020-03-21 | Payer: BC Managed Care – PPO

## 2020-03-21 ENCOUNTER — Ambulatory Visit: Admit: 2020-03-21 | Discharge: 2020-03-22 | Payer: BC Managed Care – PPO

## 2020-03-21 DIAGNOSIS — G4733 Obstructive sleep apnea (adult) (pediatric): Secondary | ICD-10-CM

## 2020-03-21 DIAGNOSIS — I2510 Atherosclerotic heart disease of native coronary artery without angina pectoris: Secondary | ICD-10-CM

## 2020-03-21 DIAGNOSIS — M109 Gout, unspecified: Secondary | ICD-10-CM

## 2020-03-21 DIAGNOSIS — E785 Hyperlipidemia, unspecified: Secondary | ICD-10-CM

## 2020-03-21 DIAGNOSIS — G629 Polyneuropathy, unspecified: Secondary | ICD-10-CM

## 2020-03-21 DIAGNOSIS — I10 Essential (primary) hypertension: Secondary | ICD-10-CM

## 2020-03-21 MED ORDER — PREGABALIN 150 MG PO CAP
150 mg | ORAL_CAPSULE | Freq: Three times a day (TID) | ORAL | 0 refills | Status: AC
Start: 2020-03-21 — End: ?

## 2020-03-21 MED ORDER — EMU OIL 120ML
360 mL | TOPICAL | 3 refills | Status: AC | PRN
Start: 2020-03-21 — End: ?

## 2020-03-24 ENCOUNTER — Encounter: Admit: 2020-03-24 | Discharge: 2020-03-24 | Payer: BC Managed Care – PPO

## 2020-04-11 ENCOUNTER — Encounter: Admit: 2020-04-11 | Discharge: 2020-04-11 | Payer: BC Managed Care – PPO

## 2020-07-19 IMAGING — CR UP_EXM
3 series · 3 of 3 positions shown · non-contrast
Comparison: none

[elbow lat]
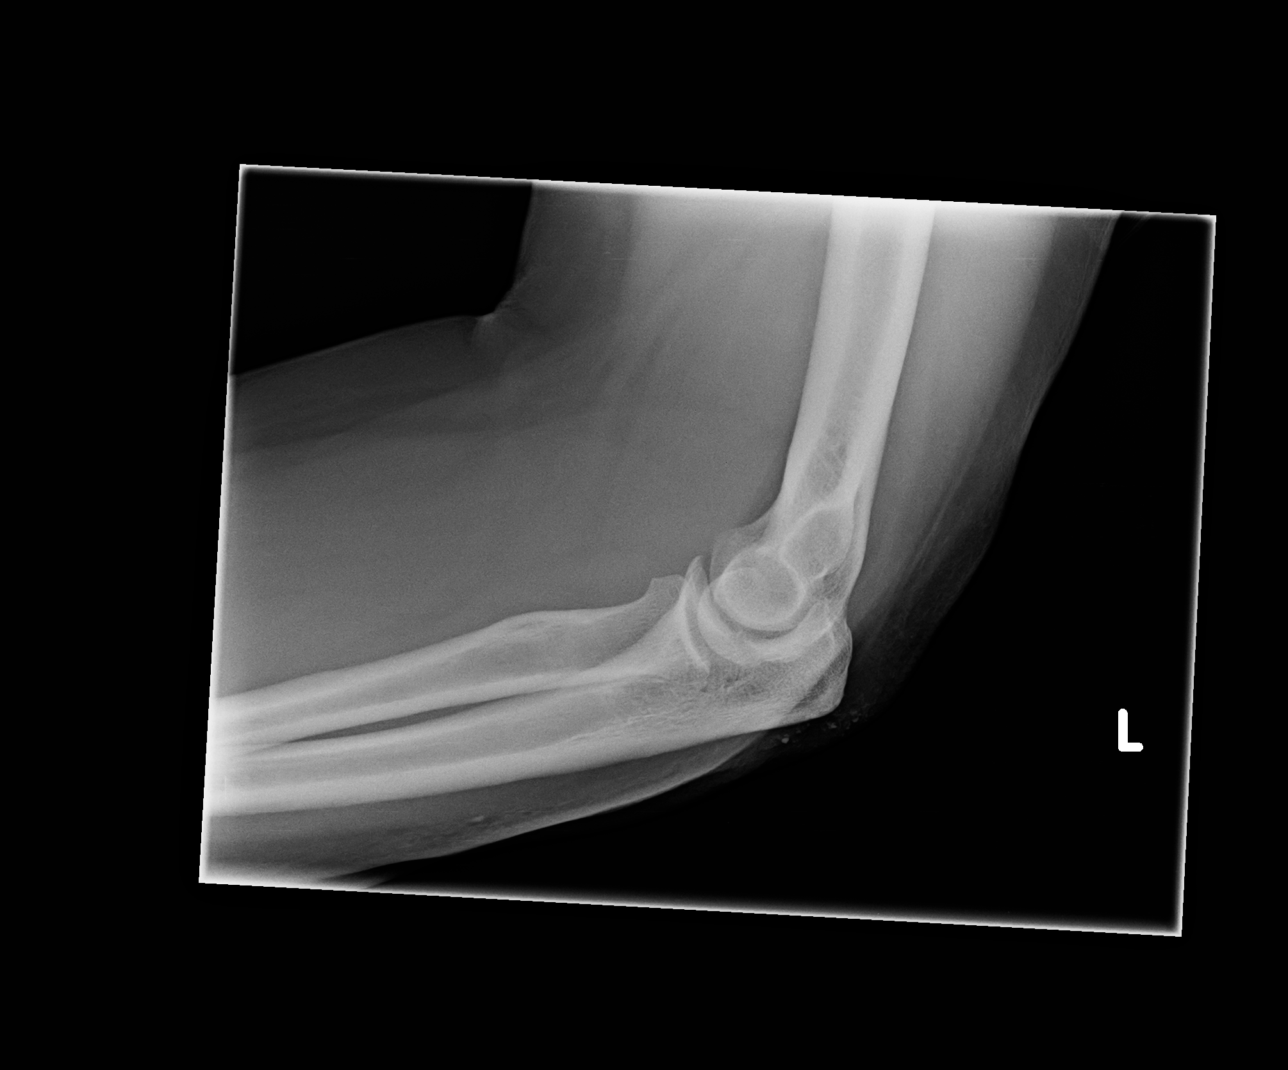

[wrist pa]
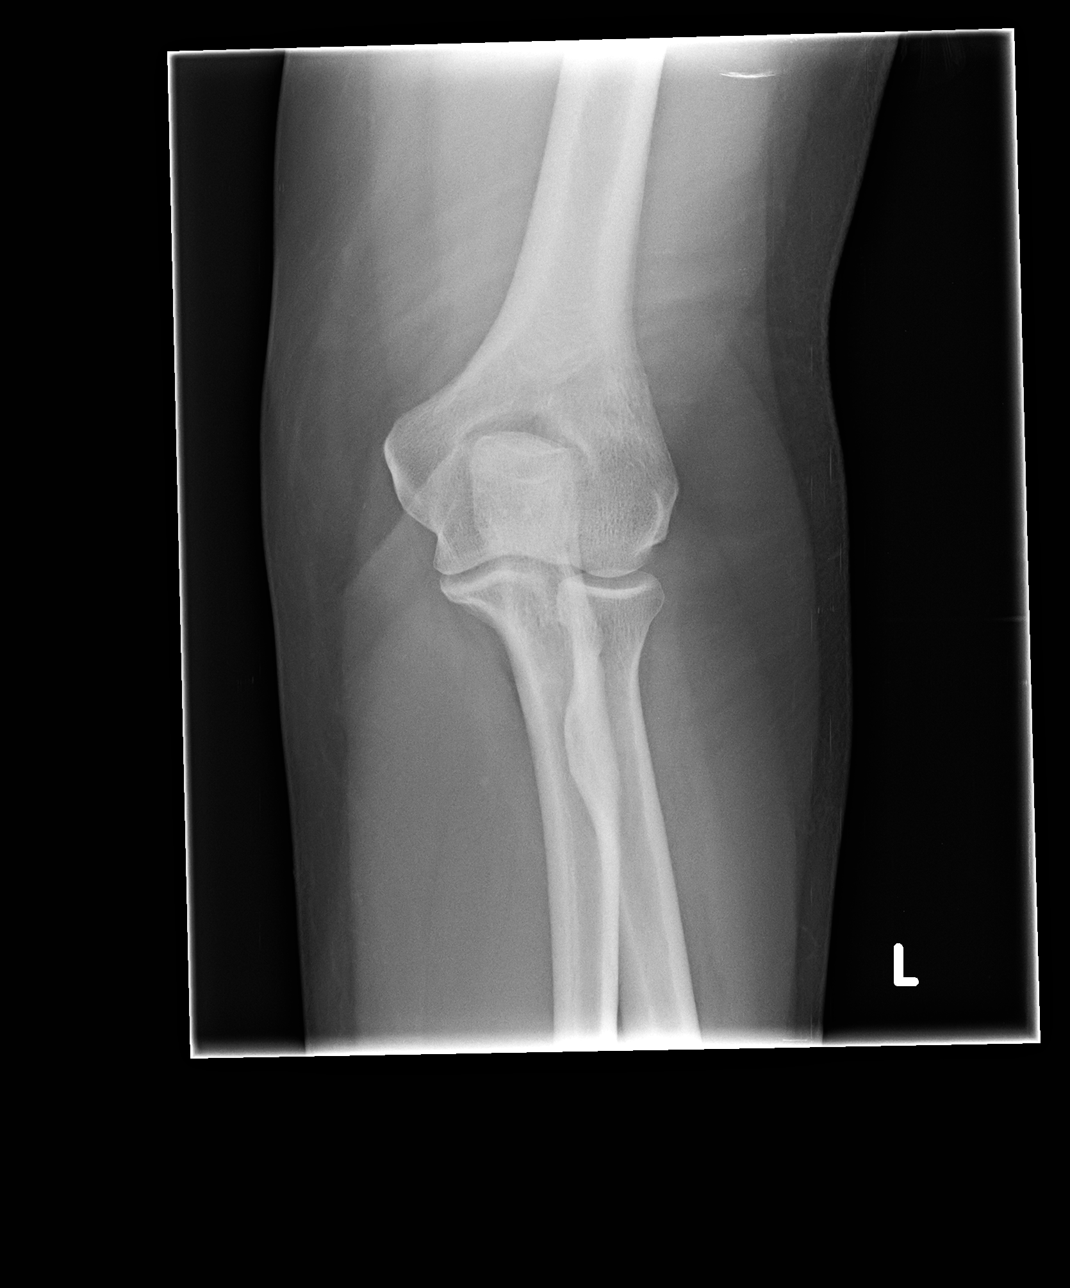

[elbow obl]
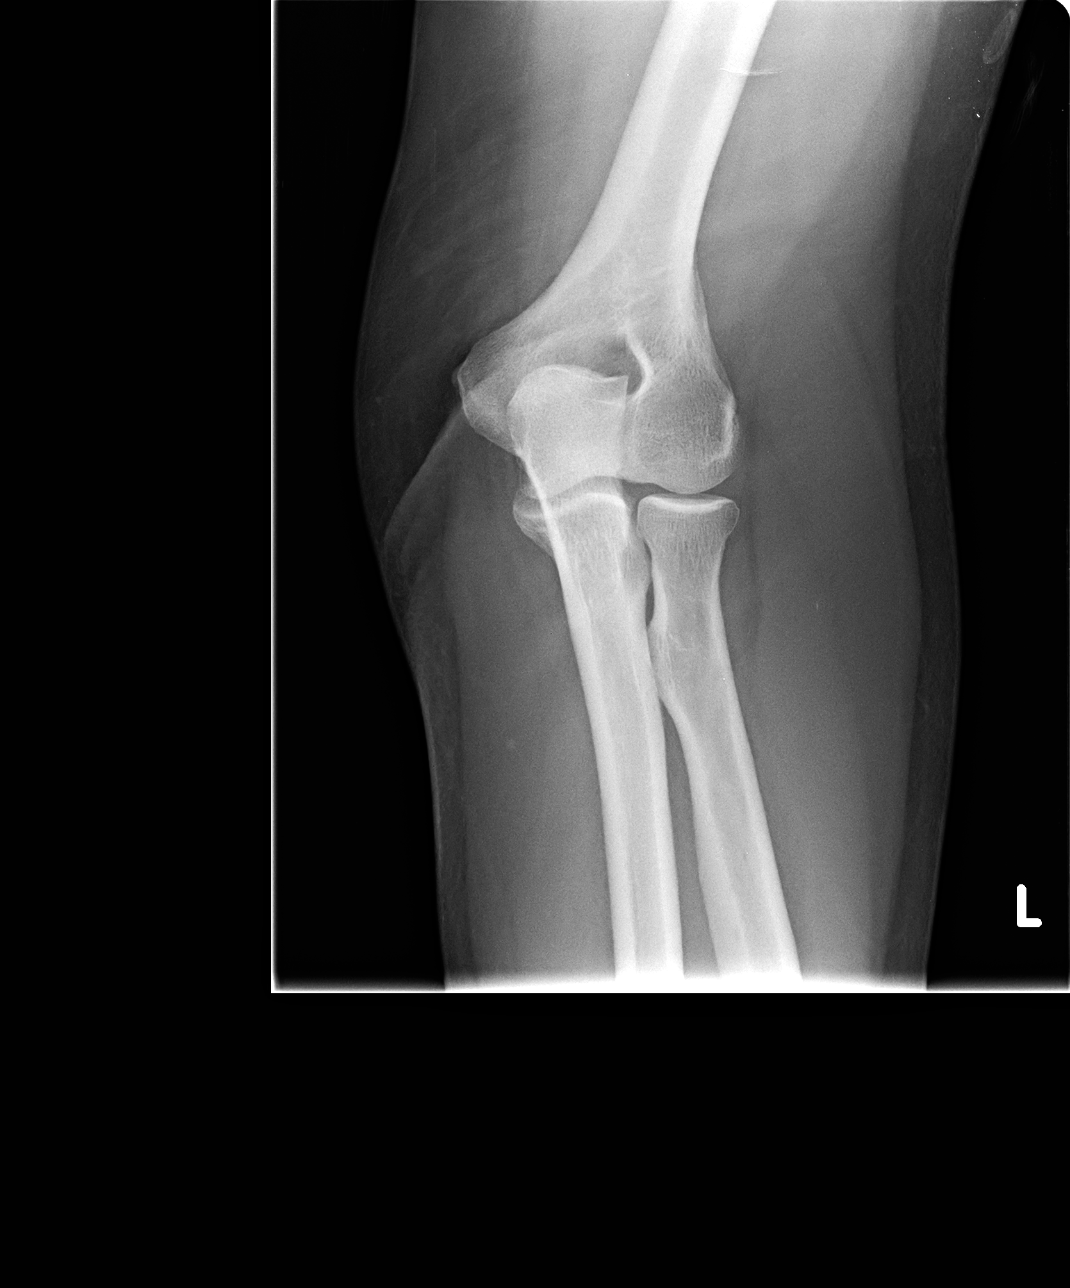

[3 of 3 positions shown; findings below may reference images not displayed]

Primary:     PENJIVRAG, MILOVIC                                                        Date: 05/

DIAGNOSTIC STUDIES

EXAM

Left elbow

INDICATION

fall, hit elbow
Patient fell out of tractor trailer 4 feet landing on asphalt hitting head and elbow. BG

TECHNIQUE

AP lateral and oblique views of the left elbow were obtained.

COMPARISONS

None available

FINDINGS

No fractures are seen. Small radiodensities project in the soft tissues overlying the olecranon
which could indicate foreign bodies within the soft tissues.

IMPRESSION

No fractures or dislocations.

Tiny radiodensities projecting within the soft tissues overlying the olecranon as described.

Tech Notes:

Patient fell out of tractor trailer 4 feet landing on asphalt hitting head and elbow.
BG

## 2020-07-19 IMAGING — CT BRAIN WO(Adult)
3 of 4 series · 14 of 47 positions shown, 16 images · non-contrast
Comparison: none

[Series 2: brain ax 5.00 hr40 s3 · axial · 0.44mm/px · z∈[-579,-445]mm · 8 of 33 slices shown, 10 images]
[im 3/33  brain]
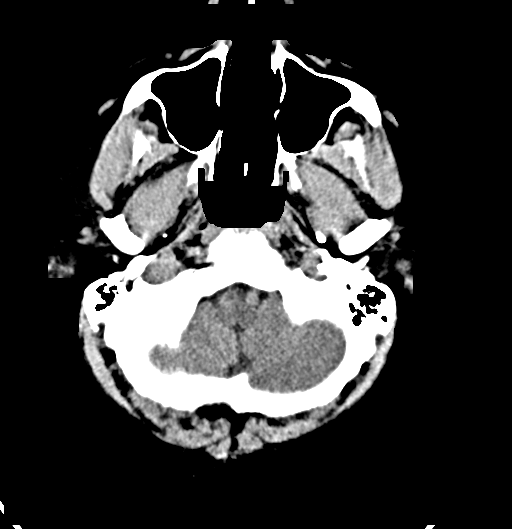
[im 3/33  bone]
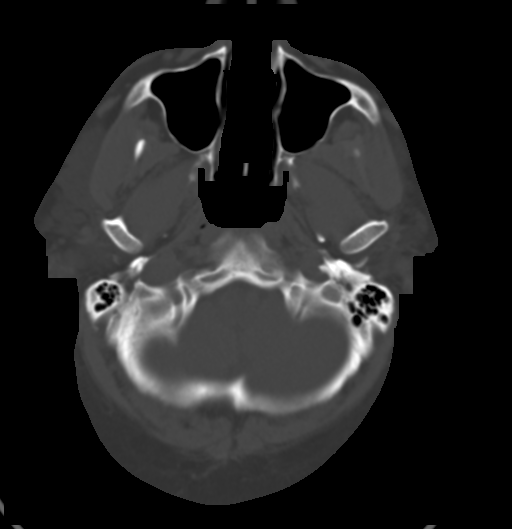
[im 7/33  brain]
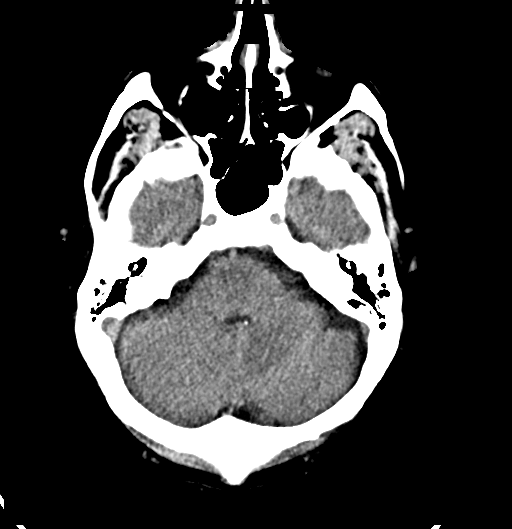
[im 12/33  brain]
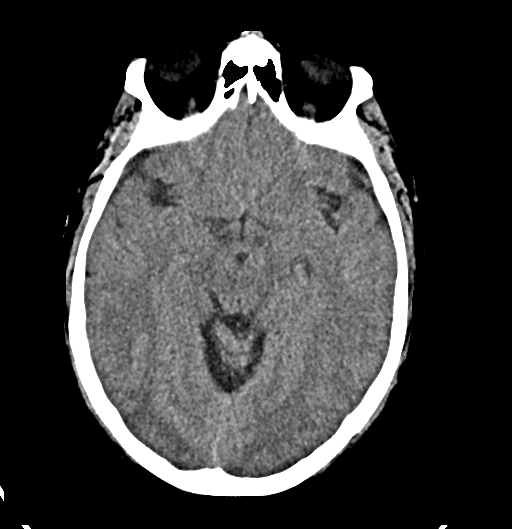
[im 14/33  brain]
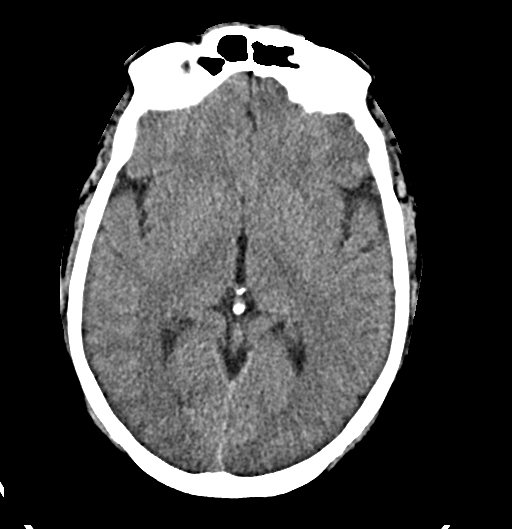
[im 19/33  brain]
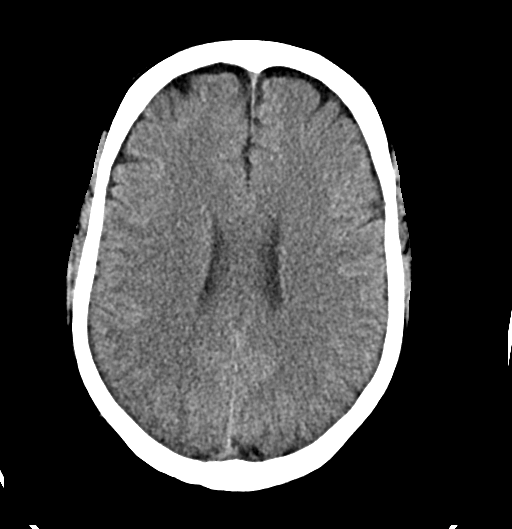
[im 19/33  bone]
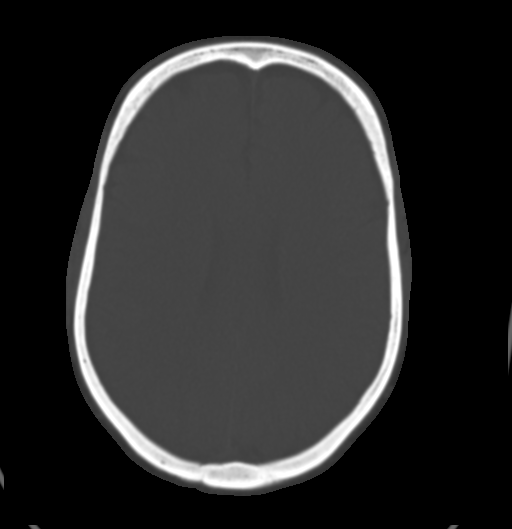
[im 21/33  brain]
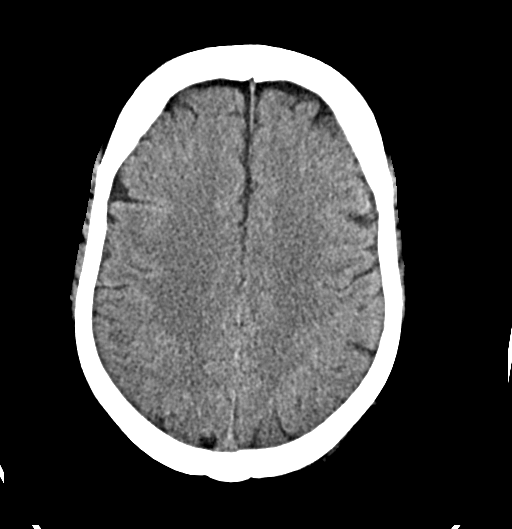
[im 26/33  brain]
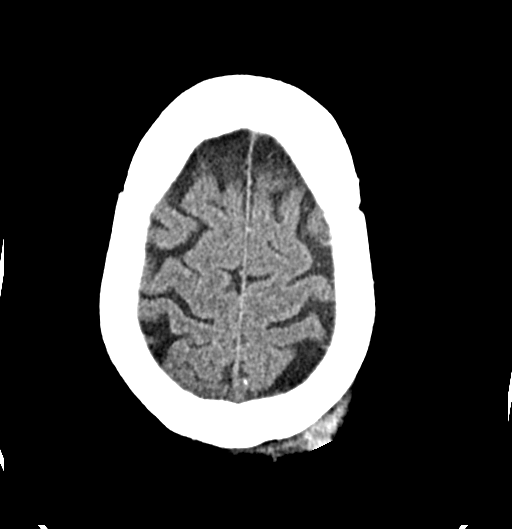
[im 30/33  brain]
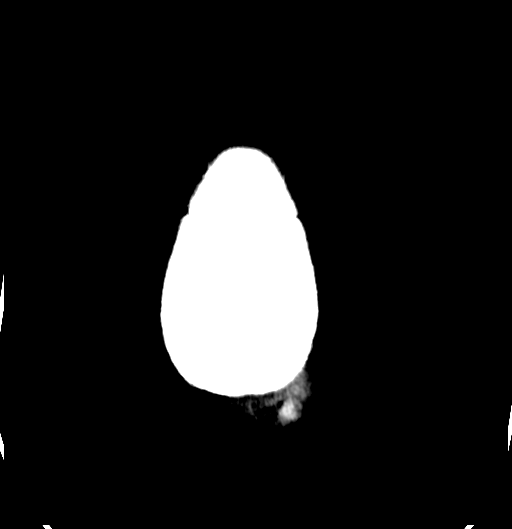

[Series 4: brain cor 5.00 hr40 s3 · coronal · 0.33mm/px · 3 of 44 slices shown]
[im 15/44  brain]
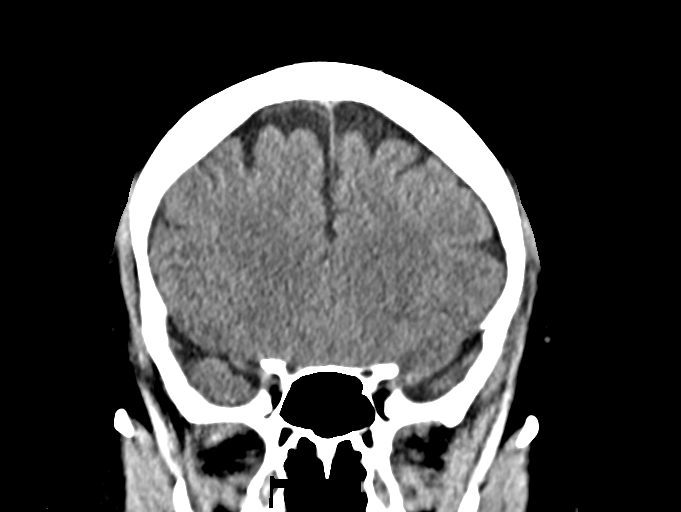
[im 20/44  brain]
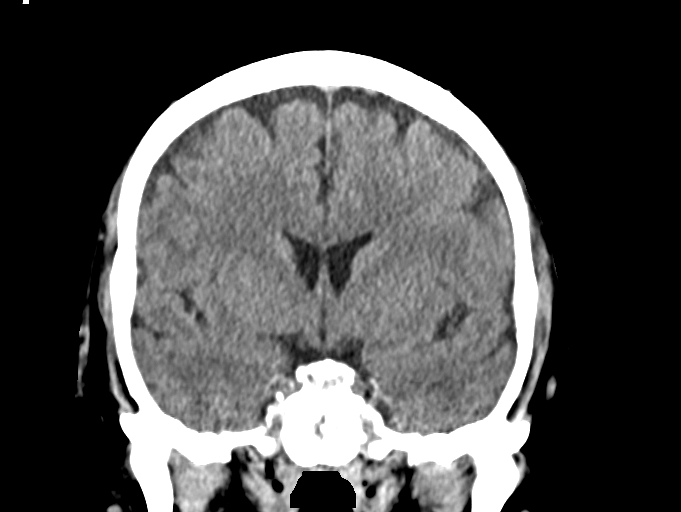
[im 24/44  brain]
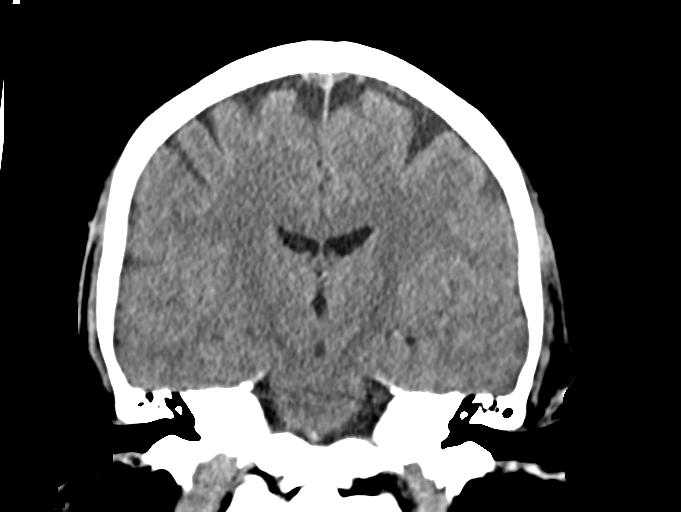

[Series 6: brain sag 5.00 hr40 s3 · sagittal · 0.33mm/px · 3 of 44 slices shown]
[im 17/44  brain]
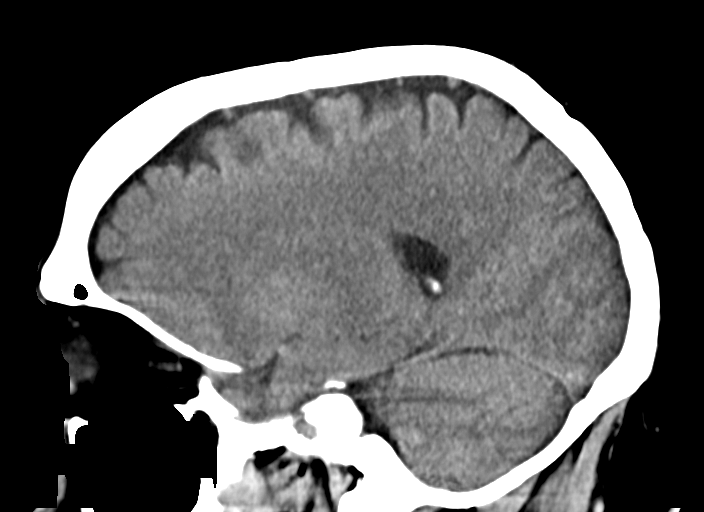
[im 22/44  brain]
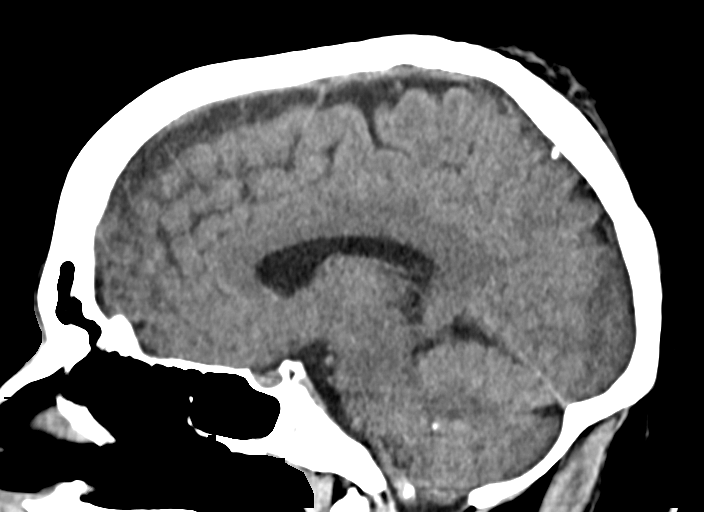
[im 27/44  brain]
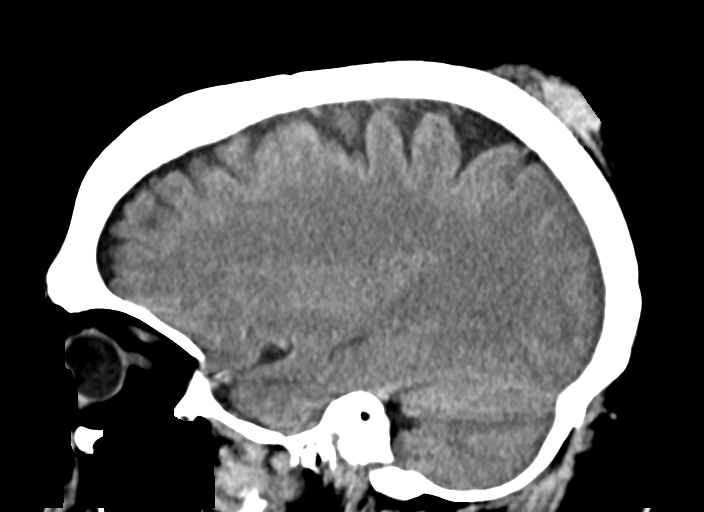

[14 of 47 positions shown; findings below may reference images not displayed]

Primary:     MAE, YESSIKA                                                        Date: 05/

DIAGNOSTIC STUDIES

EXAM

CT scan of the head without contrast.

INDICATION

fall 4 ft, hit head, no LOC,C/O dizziness
fall from 4' hit left side of head, dizziness

TECHNIQUE

All CT scans at this facility use dose modulation, iterative reconstruction, and/or weight based
dosing when appropriate to reduce radiation dose to as low as reasonably achievable.

Number of previous computed tomography exams in the last 12 months is 0 .

Number of previous nuclear medicine myocardial perfusion studies in the last 12 months is 0 .

COMPARISONS

None available

FINDINGS

No intracranial mass or hemorrhage is seen. Ventricular system is normal. There is a left right
scalp hematoma near the vertex image 26 series 2. No skull fractures are seen.

IMPRESSION

Scalp hematoma near the vertex on the left. No intracranial mass or hemorrhage is seen.

Tech Notes:

fall from 4' hit left side of head, dizziness

## 2020-12-22 ENCOUNTER — Encounter: Admit: 2020-12-22 | Discharge: 2020-12-22 | Payer: BC Managed Care – PPO

## 2020-12-22 MED ORDER — VENLAFAXINE 100 MG PO TAB
ORAL_TABLET | Freq: Every day | 3 refills
Start: 2020-12-22 — End: ?

## 2020-12-26 ENCOUNTER — Encounter: Admit: 2020-12-26 | Discharge: 2020-12-26 | Payer: BC Managed Care – PPO

## 2021-06-06 ENCOUNTER — Encounter: Admit: 2021-06-06 | Discharge: 2021-06-06 | Payer: BC Managed Care – PPO

## 2021-06-06 MED ORDER — VENLAFAXINE 100 MG PO TAB
ORAL_TABLET | Freq: Every day | 1 refills
Start: 2021-06-06 — End: ?

## 2021-09-24 ENCOUNTER — Encounter: Admit: 2021-09-24 | Discharge: 2021-09-24 | Payer: BC Managed Care – PPO

## 2021-09-24 MED ORDER — VENLAFAXINE 100 MG PO TAB
ORAL_TABLET | 0 refills
Start: 2021-09-24 — End: ?

## 2022-02-10 ENCOUNTER — Encounter: Admit: 2022-02-10 | Discharge: 2022-02-10 | Payer: BC Managed Care – PPO

## 2022-02-10 DIAGNOSIS — Z52001 Unspecified donor, stem cells: Secondary | ICD-10-CM

## 2022-02-10 NOTE — Telephone Encounter
Potential donor was contacted and counseled on methods of HLA typing which include both buccal swabs and blood draws.  Reviewed with potential donor that if they are selected as a donor, an evaluation which includes confirmatory typing, routine labs, viral panel, chest x-ray and ekg as well as a history and physical and consent.  Educated the potential donor on the procedures for both peripheral stem cell as well as bone marrow harvest donation.  Reviewed donor's overall health and there are no issues to proceeding with preliminary HLA typing.  The potential donor was given time to ask questions and understood that they have the option to decline being a donor.

## 2022-02-15 ENCOUNTER — Encounter: Admit: 2022-02-15 | Discharge: 2022-02-25 | Payer: Private Health Insurance - Indemnity

## 2022-02-25 DIAGNOSIS — Z52001 Unspecified donor, stem cells: Secondary | ICD-10-CM

## 2022-03-09 ENCOUNTER — Encounter: Admit: 2022-03-09 | Discharge: 2022-03-09 | Payer: BC Managed Care – PPO

## 2022-03-09 DIAGNOSIS — Z52001 Unspecified donor, stem cells: Secondary | ICD-10-CM

## 2022-03-09 DIAGNOSIS — Z01812 Encounter for preprocedural laboratory examination: Secondary | ICD-10-CM

## 2022-03-09 NOTE — Progress Notes
Ricky Cordova,      March 09, 2022    Here is an outline of the appointments for your evaluation testing. Bring this list of appointments with you to ensure all test are completed.   Please be advised that you are allowed one visitor per patient. Masks are required within the Cancer Center and provided at our entrances.  Anyone who has a fever or other cold or flu-like symptoms will not be allowed in our facilities.  Of note, you are welcome to bring a support person to all appointments, but the stem cell recipient may not attend the consent visit with you due to conflict of interest. Additionally, no one under age 74 is allowed inside our BMT or Apheresis clinics.      03/10/22:  [facility]   [address] [place], [place] [address]  8:00am:  Apheresis Education and Vein Evaluation.    ? Come in the main entrance of the hospital. Walk past the 6 pack of elevators. Turn Left. Walk down the hallway past the overhead sign [facility] and enter the door on the right next to the large BMT Apheresis sign. You will need to push the square silver button on the wall next to the door to enter. Walk down the hall and enter the last door on the left marked BMT Apheresis.      [facility]   [address] [place], [place], [place] [address]  9:20am:  Labs - Level 3 - Out pt. BMT Clinic.   NON fasting, please hydrate well.  9:30am:  EKG - Level 3 - Out pt. BMT Clinic.  10:00am:  Health & Physical - Level 3 - Out pt. BMT Clinic.  11:10am: Education Session with Ricky Cordova - Level 3 - Out pt. BMT Clinic.  12:30pm:  Chest X-Ray - Level 1 Out Pt. Radiology - X-Ray.   ~Please note: if you are using the MyChart app to view your appointments, this one will NOT show up because it is a walk-in clinic.    03/22/22:    [facility]   [address] [place], [place], [place] [address]  11:50am:  Consent appt - Level 3 - Out pt. BMT Clinic.    the following appt is NOT scheduled at this time and may not be needed.  When you have your Apheresis Education and Vein Evaluation appt they will determine the plan.  It will be 1 of the 3 following options:    1. You have adequate vein access in your arms to place an IV morning of collection, no central line is needed.    2. You likely have adequate vein access in your arms, however we need to be prepared to place a central line if they have trouble with your veins morning of collection. In this situation we?ll schedule the procedure but cancel it if the arm veins are successful.  You?ll start the morning at Apheresis and they will send you to radiology if the line is needed.  3. Your arm veins are inadequate for collection and a temporary central line is necessary. You will receive a phone call the day prior to collection with instructions on where to start in the morning, Apheresis versus Interventional Radiology.  Once your line has been placed, Apheresis will receive you from the recovery room back in their clinic.  The temporary line will be removed at the end of collection.  DATE TBD:                [facility]                          [address], [place], [place] [address]  TIME TBD:     Interventional Radiology (for Temp. [employer]) - 2nd floor Out Pt. Radiology                          ~ TBD Check In ~  ~ Interventional radiology staff will call you prior to date of procedure to advise regarding any medications that may need to be held, and any fasting requirements.  ~In most cases, this procedure is done in under 30 minutes using only local lidocaine injection for numbing. Upon request, sedation can be used if lidocaine is not sufficient. This must be discussed beforehand.  If it is planned for you to receive sedation, you will be given instructions regarding fasting prior to the appointment, and you will need to identify a support person to drive you home following collection.  ~Plan to be in radiology anywhere from 2-4 hours, due to post-operative recovery time.      Ricky Cordova notes for you:    ? Focus on good hydration starting now and continue following donation for a couple of weeks.  Increase iron in your diet now thru 1-2 months after donation to facilitate your own recovery. Minimize alcohol intake until donation is complete.  ? We ask that during the donation process you take all measures possible to avoid contracting a viral illness that may delay transplant.  This includes masking in indoor public spaces at all times, avoiding those who are ill, and washing hands or using hand sanitizer diligently.  ? Specific instructions for peripheral blood stem cell donors: Increase calcium in your diet or take TUMS on days of injections, NOT needed after donation.  For pain control on days of injections and day of collection - take Tylenol/Acetaminophen and add Claritin/Loratadine if needed.  We can prescribe something stronger if this is inadequate.  Avoid NSAID class drugs (Aspirin, Aleve/Naproxen, Ibuprofen/Motrin) starting on first day of injections thru the day following collection.   ? If you cannot keep your appointments, please call or email as soon as possible so we can reschedule.    ? If anything changes in your own health or availability, please call Ricky Cordova directly or the main BMT triage line to let us know as soon as possible.  ? If you have any questions or would like a map or driving direction to any of the locations, please don?t hesitate to reach out.    Thank you for your sacrifice and heroic efforts,   Ricky Cordova, BMT Registrar  c/o  Ricky Barr, Ricky Cordova, BSN, [facility]  Blood and Marrow Transplant Ricky Cordova     [email or web address]  desk phone [phone] (landline, cannot receive texts)  BMT Clinic 24/7 triage line (after hours/weekend/holiday/emergency): [phone]  Office Specialists for scheduling needs:  [phone], [phone], or [phone]  fax (for leave paperwork) send Attn: [place] @ [phone]

## 2022-03-09 NOTE — Telephone Encounter
Otniel Hoe         [id number]  Jun 20, 1960 62 y.o.    Called and spoke with [name] regarding upcoming stem cell donation plan.     Related donor for: blue team recipient:  Anais, Koenen  MRN: [id number]  Dx: AML  Recipient pre-transplant coordinator:  Shelleen/Abhyankar  Donor relationship to recipient: brother  HLA match: full match  Stem cell source:  [place]  Graft manipulation:  Not Applicable    Scheduling as follows:  10/11 donor eval, H&P with NP, COVID swab   10/23 donor consent with TBD  TBD COVID swab (48-72 hrs prior to recipient prep)  TBD MOB start  TBD COVID swab (48-72 hrs prior to collection)  TBD PBSC collection    Discussed the following logistical considerations:   Travel and lodging needs? None, lives [place] min away  FMLA paperwork needs or letters for employer? Will send letter to donor to provide to his employer once transplant dates known  Other concerns? None

## 2022-03-09 NOTE — Progress Notes
Ricky Cordova         [id number]  1960/09/28 61 y.o.    Met with [name] today for initial stem cell donation education and evaluation appointments.  Donor was educated regarding the two methods of stem cell donation, the expected and unexpected side effects for both methods, and the voluntary nature of their donation.  Donor was provided with consents, educational materials, and coordinator contact information.     Related Donor for: [name], [name], MRN: [id number] Age: 76 Dx: AML  Recipient Team Color: blue, Recipient Pre-Transplant Coordinator: Shelleen/Abhyankar  Donor Relationship to Recipient: brother  Match Status: full match  Stem Cell Source Requested by Recipient Team:  [place]  Graft Manipulation:  Not Applicable    Scheduling as follows:  10/23 Donor consent  TBD COVID swab (48-72 hrs prior to recipient prep start)  TBD MOB start  TBD COVID swab (48-72 hrs prior to collection)  TBD PBSC collection

## 2022-03-10 ENCOUNTER — Encounter: Admit: 2022-03-10 | Discharge: 2022-03-10 | Payer: BC Managed Care – PPO

## 2022-03-10 ENCOUNTER — Ambulatory Visit: Admit: 2022-03-10 | Discharge: 2022-03-10 | Payer: Private Health Insurance - Indemnity

## 2022-03-10 ENCOUNTER — Encounter: Admit: 2022-03-10 | Discharge: 2022-03-10 | Payer: Private Health Insurance - Indemnity

## 2022-03-10 DIAGNOSIS — Z52001 Unspecified donor, stem cells: Secondary | ICD-10-CM

## 2022-03-10 DIAGNOSIS — I2510 Atherosclerotic heart disease of native coronary artery without angina pectoris: Secondary | ICD-10-CM

## 2022-03-10 DIAGNOSIS — G629 Polyneuropathy, unspecified: Secondary | ICD-10-CM

## 2022-03-10 DIAGNOSIS — E785 Hyperlipidemia, unspecified: Secondary | ICD-10-CM

## 2022-03-10 DIAGNOSIS — G4733 Obstructive sleep apnea (adult) (pediatric): Secondary | ICD-10-CM

## 2022-03-10 DIAGNOSIS — M109 Gout, unspecified: Secondary | ICD-10-CM

## 2022-03-10 DIAGNOSIS — D6489 Other specified anemias: Secondary | ICD-10-CM

## 2022-03-10 DIAGNOSIS — I10 Essential (primary) hypertension: Secondary | ICD-10-CM

## 2022-03-10 DIAGNOSIS — Z01812 Encounter for preprocedural laboratory examination: Secondary | ICD-10-CM

## 2022-03-10 LAB — URINALYSIS MICROSCOPIC REFLEX TO CULTURE

## 2022-03-10 LAB — URINALYSIS DIPSTICK REFLEX TO CULTURE
GLUCOSE,UA: NEGATIVE
LEUKOCYTES: NEGATIVE
NITRITE: NEGATIVE
PROTEIN,UA: NEGATIVE
URINE ASCORBIC ACID, UA: NEGATIVE
URINE BILE: NEGATIVE
URINE BLOOD: NEGATIVE
URINE KETONE: NEGATIVE
URINE PH: 6 (ref 5.0–8.0)
URINE SPEC GRAVITY: 1 (ref 1.005–1.030)

## 2022-03-10 LAB — CBC AND DIFF
ABSOLUTE BASO COUNT: 0.1 K/UL (ref 0–0.20)
ABSOLUTE EOS COUNT: 0.2 K/UL (ref 0–0.45)
ABSOLUTE LYMPH COUNT: 2.1 K/UL (ref 1.0–4.8)
ABSOLUTE MONO COUNT: 0.8 K/UL (ref 0–0.80)
ABSOLUTE NEUTROPHIL: 4.5 K/UL (ref 1.8–7.0)
BASOPHILS %: 1 % (ref 0–2)
EOSINOPHILS %: 2 % (ref 0–5)
HEMATOCRIT: 37 % — ABNORMAL LOW (ref 40–50)
HEMOGLOBIN: 12 g/dL — ABNORMAL LOW (ref 13.5–16.5)
LYMPHOCYTES %: 28 % (ref >60)
MCH: 32 pg (ref 26–34)
MCHC: 34 g/dL (ref 32.0–36.0)
MCV: 95 FL (ref 80–100)
MONOCYTES %: 10 % (ref 4–12)
NEUTROPHILS %: 59 % (ref 41–77)
PLATELET COUNT: 236 K/UL (ref 150–400)
RBC COUNT: 3.9 M/UL — ABNORMAL LOW (ref 4.4–5.5)
RDW: 14 % (ref 11–15)
WBC COUNT: 7.7 K/UL (ref 4.5–11.0)

## 2022-03-10 LAB — COMPREHENSIVE METABOLIC PANEL
GLUCOSE,PANEL: 95 mg/dL (ref 70–100)
POTASSIUM: 4 MMOL/L (ref 3.5–5.1)
SODIUM: 136 MMOL/L — ABNORMAL LOW (ref 137–147)

## 2022-03-10 LAB — PTT (APTT): PTT: 33 s (ref 24.0–36.5)

## 2022-03-10 LAB — PROTIME INR (PT)
INR: 1 % (ref 0.8–1.2)
PROTIME: 11 s — ABNORMAL LOW (ref 9.5–14.2)

## 2022-03-10 LAB — IRON + BINDING CAPACITY + %SAT+ FERRITIN
% SATURATION: 29 % (ref 28–42)
FERRITIN: 141 ng/mL (ref 30–300)
IRON BINDING: 346 ug/dL (ref 270–380)
IRON: 100 ug/dL (ref 50–185)

## 2022-03-10 LAB — MAGNESIUM: MAGNESIUM: 1.7 mg/dL (ref 1.6–2.6)

## 2022-03-10 LAB — PHOSPHORUS: PHOSPHORUS: 3.7 mg/dL (ref 2.0–4.5)

## 2022-03-10 LAB — CMV AB IGG: CMV IGG: NEGATIVE MMOL/L (ref 98–110)

## 2022-03-10 LAB — RETICULOCYTE COUNT
ABSOLUTE RETIC CT: 51 K/UL (ref 30–94)
CORRECTED RETIC: 1 %
UNCORRECTED RETIC: 1.3 % (ref 0.5–2.0)

## 2022-03-10 LAB — COVID-19 (SARS-COV-2) PCR

## 2022-03-10 LAB — LDH-LACTATE DEHYDROGENASE: LDH: 194 U/L (ref 100–210)

## 2022-03-10 LAB — TRANSFERRIN: TRANSFERRIN: 233 mg/dL (ref 185–336)

## 2022-03-10 NOTE — Progress Notes
VEIN ASSESSMENT AND EDUCATION    Leukopheresis education provided and vein assessment performed.     Veins are adequate, but need a backup catheter.    If required, second RN check: Josph Macho, RN    Patient in clinic for apheresis education. Patient is donating for brother. Reviewed expected routine along with current plan in place for collection. Patient has no active limb restrictions. Wife present for education. On [place], veins appear acceptable but requesting backup in IR be scheduled for day of collection. Patient allowed time to ask questions. Patient and wife verbalize understanding of plan of care. Denies any other questions at this time.

## 2022-03-10 NOTE — Progress Notes
Donor H&P  Hematologic Malignancies & Cellular Therapeutics    Name: Ricky Cordova   MRN: [id number]   DOB: [birth year] Age: 61 y.o.     Date of Service: 03/10/2022     Reason for Visit: Evaluation for an allogeneic stem cell donation.    Related Donor for: Ricky Cordova, Ricky Cordova, MRN: [id number] Age: 32 Dx: AML  Recipient Team Color: blue, Recipient Pre-Transplant Coordinator: Ricky Cordova  Donor Relationship to Recipient: brother  Match Status: full match  Stem Cell Source Requested by Recipient Team:  PSC  Graft Manipulation:  Not Applicable      Subjective: Ricky Cordova is a 61 y.o. male Doing well today. Reports he is an early riser, has been up since 0430. Endorses rhinorrhea while eating and when showering. No fever, sore throat, or cough. No GI complaints. History of drug and alcohol abuse. Currently sober for 5-6 years.       Cancer Staging   No matching staging information was found for the patient.     History of Present Illness:    Medical History:   Diagnosis Date   ? Coronary artery disease    ? Gout    ? Hyperlipidemia    ? Hypertension    ? Neuropathy    ? Neuropathy    ? OSA on CPAP      Surgical History:   Procedure Laterality Date   ? TONSILLECTOMY  1968   ? BALLOON ANGIOPLASTY, ARTERY       Family History   Problem Relation Age of Onset   ? Thyroid Disease Mother    ? None Reported Father    ? None Reported Sister    ? Cancer Brother 50        Acute myeloid leukemia   ? Cancer Maternal Grandmother    ? Diabetes Maternal Grandfather    ? Hypertension Maternal Grandfather    ? Heart Failure Maternal Grandfather    ? Cancer Other    ? Hypertension Other    ? Thyroid Disease Other      Social History     Tobacco Use   ? Smoking status: Former     Packs/day: 2.00     Years: 10.00     Additional pack years: 0.00     Total pack years: 20.00     Types: Cigarettes     Quit date: 1990     Years since quitting: 33.7   ? Smokeless tobacco: Current     Types: Chew   Substance and Sexual Activity   ? Alcohol use: Not Currently     Comment: 15   ? Drug use: Yes     Types: Cocaine, Other-See Comments     Comment: Cocaine and speed x 4 years; quit 1991       Immunizations (includes history and patient reported):   Immunization History   Administered Date(s) Administered   ? COVID-19 [facility]), mRNA vacc, 100 mcg/0.5 mL (PF) 08/02/2019, 08/29/2019, 11/20/2020   ? COVID-19 (PFIZER), mRNA vacc, 30 mcg/0.3 mL (PF) 04/05/2020   ? COVID-19 Bivalent (64YR+)(PFIZER), mRNA vacc, 30mcg/0.3mL 03/07/2021        Allergies:  Patient has no known allergies.    Medications:     Medication List          Accurate as of March 10, 2022 11:06 AM. If you have any questions, ask   your nurse or doctor.            CONTINUE taking  these medications    ? allopurinoL 300 mg tablet; Commonly known as: ZYLOPRIM  ? amLODIPine 5 mg tablet; Commonly known as: NORVASC  ? aspirin EC 81 mg tablet; Commonly known as: ASPIR-LOW  ? atorvastatin 20 mg tablet; Commonly known as: LIPITOR  ? benfotiamine 150 mg Cap  ? CLARITIN 10 mg tablet; Generic drug: loratadine  ? DIETARY SUPPLEMENT PO  ? docusate 100 mg capsule; Commonly known as: COLACE  ? doxazosin 2 mg tablet; Commonly known as: CARDURA  ? folic acid 400 mcg tablet; Commonly known as: FOLATE  ? hydroCHLOROthiazide 25 mg tablet; Commonly known as: HYDRODIURIL  ? metoprolol succinate XL 25 mg extended release tablet; Commonly known   as: TOPROL XL  ? olmesartan 20 mg tablet; Commonly known as: BENICAR  ? ONE-A-DAY MEN'S 50PLUS(GINKGO) 400-300-120 mcg-mcg-mg Tab; Generic drug:   mv-mins-folic-lycopene-ginkgo  ? PHILLIPS' COLON HEALTH PO  ? pregabalin 150 mg capsule; Commonly known as: LYRICA  ? PRILOSEC PO  ? venlafaxine 100 mg tablet; Commonly known as: EFFEXOR; TAKE 1 TABLET   DAILY. TAKE  WITH FOOD FOR NEUROPATHIC  PAIN.  ? VITAMIN D3 5000 unit tablet; Generic drug: CHOLEcalciferoL (vitamin D3)           Review of Systems:  Constitutional: no fevers and night sweats, No fatigue or weight loss  HENT: +Rhinorrhea with eating and showering  Eyes: Negative  CVS: No chest pain, edema, or palpitations  Respiratory: No shortness of breath, wheezing, cough,   Abdomen: No abdominal pain,   nausea, vomiting, diarrhea,    GU: No dysuria, or hematuria  MSK: No arthralgias, back pain, joint swelling   Dermatology: No pallor, rash, wound, or color changes  Neurology: No headaches, dizziness,   Hematologic: No bruising, swelling, or adenopathy  Psych: No behavior problem, confusion, mood changes       Physical Exam:  Vital Signs:  Vitals:    03/10/22 0939 03/10/22 0942   BP: 131/69    BP Source: Arm, Right Upper    Pulse: 66    Temp: 36.9 ?C (98.5 ?F)    Resp: 20    SpO2: 97%    TempSrc: Oral    PainSc:  Zero   Weight: (!) 169 kg (372 lb 9.6 oz)         Weight:  Wt Readings from Last 3 Encounters:   03/10/22 (!) 169 kg (372 lb 9.6 oz)   03/21/20 (!) 152.4 kg (336 lb)   10/18/18 (!) 145.2 kg (320 lb)        Karnofsky Scale: 100% Normal, no complaints    Vital signs and nursing notes reviewed    General:  Alert, cooperative, no distress, appears stated age  Head:  Normocephalic, without obvious abnormality, atraumatic  Eyes:  Conjunctivae/corneas clear.  PERRL, EOMs intact.    Throat:  Lips, mucosa and tongue normal.  Teeth and gums normal. A few missing teeth.  Neck:  Supple, symmetrical, trachea midline, no adenopathy, thyroid: no enlargement/tenderness/nodules  Lungs:  Clear to auscultation bilaterally  Heart:    Regular rate and rhythm, S1, S2 normal, no murmur, click rub or gallop. Heart sounds diminished due to body habitus.  Abdomen:  Soft, non-tender.  Bowel sounds normal.  No masses.  No organomegaly.   Extremities:  Extremities normal, atraumatic, no cyanosis or edema  Peripheral pulses:   2+ and symmetric, all extremities  Skin:   Skin color, texture, turgor normal.  No rashes or lesions  Lymph nodes:  Cervical, supraclavicular and  axillary nodes normal  Neurologic:  CNII - XII intact.  Normal strength, sensation throughout.  Musculoskeletal:  Normal / Negative  Psych:  Normal      Lab/Radiology/Other Diagnostic Tests:    CBC w diff    Lab Results   Component Value Date/Time    WBC 7.7 03/10/2022 09:15 AM    RBC 3.93 (L) 03/10/2022 09:15 AM    HGB 12.8 (L) 03/10/2022 09:15 AM    HCT 37.4 (L) 03/10/2022 09:15 AM    MCV 95.0 03/10/2022 09:15 AM    MCH 32.6 03/10/2022 09:15 AM    MCHC 34.3 03/10/2022 09:15 AM    RDW 14.2 03/10/2022 09:15 AM    PLTCT 236 03/10/2022 09:15 AM    MPV 7.9 03/10/2022 09:15 AM    Lab Results   Component Value Date/Time    NEUT 59 03/10/2022 09:15 AM    ANC 4.50 03/10/2022 09:15 AM    LYMA 28 03/10/2022 09:15 AM    ALC 2.10 03/10/2022 09:15 AM    MONA 10 03/10/2022 09:15 AM    AMC 0.80 03/10/2022 09:15 AM    EOSA 2 03/10/2022 09:15 AM    AEC 0.20 03/10/2022 09:15 AM    BASA 1 03/10/2022 09:15 AM    ABC 0.10 03/10/2022 09:15 AM        Comprehensive Metabolic Profile    Lab Results   Component Value Date/Time    NA 136 (L) 03/10/2022 09:15 AM    K 4.0 03/10/2022 09:15 AM    CL 102 03/10/2022 09:15 AM    CO2 26 03/10/2022 09:15 AM    GAP 8 03/10/2022 09:15 AM    BUN 22 03/10/2022 09:15 AM    CR 1.09 03/10/2022 09:15 AM    GLU 95 03/10/2022 09:15 AM    Lab Results   Component Value Date/Time    CA 9.2 03/10/2022 09:15 AM    PO4 3.7 03/10/2022 09:15 AM    ALBUMIN 3.9 03/10/2022 09:15 AM    TOTPROT 6.8 03/10/2022 09:15 AM    ALKPHOS 59 03/10/2022 09:15 AM    AST 35 03/10/2022 09:15 AM    ALT 44 03/10/2022 09:15 AM    TOTBILI 0.4 03/10/2022 09:15 AM          Radiology:  Pertinent radiology reviewed and summarized in assessment and plan    ------------------------------------------------------------------------------------------------------------------------------------------------                     Assessment/Plan:    Primary diagnosis:  PSC stem cell donor for brother.    Related Donor for: Ricky Cordova, Ricky Cordova, MRN: [id number] Age: 63 Dx: AML  Recipient Team Color: blue, Recipient Pre-Transplant Coordinator: Ricky Cordova  Donor Relationship to Recipient: brother  Match Status: full match  Stem Cell Source Requested by Recipient Team:  PSC  Graft Manipulation:  Not Applicable    Heme:    Mild anemia, RBC slightly low 3.93. No comparison in chart. Patient denies knowledge of being anemic.  B12 level normal. Iron studies normal.  Monitor CBC for transfusion needs, goal Hgb>7 and platelets >10K.    FEN/Renal:    Renal function normal.   Electrolytes stable. Replace electrolytes per standard protocol.   Body mass index is 50.53 kg/m?.    Endo: NA HbA1C last checked in 2020. Blood sugar this AM 95, has eaten today.    ID:   No active infections.    Pulm: NA     CV:  EKG: SR. ATc [place] 414.  GI:   No nausea, vomiting, diarrhea, or constipation.   Liver function normal. No hepatosplenomegaly noted.    Psych:   Monitor and offer support as needed.     RTC: Per pre-transplant coordinator.      Ricky R Josedaniel Haye, APRN-NP  Hematologic Malignancies and Cellular Therapeutics    Total of 35 minutes were spent on the same day of the visit including preparing to see the patient, obtaining  and/or reviewing separately obtained history, performing a medically appropriate examination and/or  evaluation, counseling and educating the patient/family/caregiver, ordering medications, tests, or  procedures, referring and communication with other health care professionals, documenting clinical  information in the electronic or other health record, independently interpreting results and communicating  results to the patient/family/caregiver, and care coordination.

## 2022-03-18 ENCOUNTER — Encounter: Admit: 2022-03-18 | Discharge: 2022-03-18 | Payer: BC Managed Care – PPO

## 2022-03-18 DIAGNOSIS — Z01812 Encounter for preprocedural laboratory examination: Secondary | ICD-10-CM

## 2022-03-18 DIAGNOSIS — Z52001 Unspecified donor, stem cells: Secondary | ICD-10-CM

## 2022-03-18 NOTE — Progress Notes
Ricky Cordova,      March 18, 2022    Here is an outline of the appointments for your evaluation testing. Bring this list of appointments with you to ensure all test are completed.   Please be advised that you are allowed one visitor per patient. Masks are required within the Cancer Center and provided at our entrances.  Anyone who has a fever or other cold or flu-like symptoms will not be allowed in our facilities.  Of note, you are welcome to bring a support person to all appointments, but the stem cell recipient may not attend the consent visit with you due to conflict of interest. Additionally, no one under age 61 is allowed inside our BMT or Apheresis clinics.    03/22/22:    [facility]   [address] [place], [place], [place] [address]  1120am:  Labs - Level 3 - Out pt. BMT Clinic.   NON fasting, please hydrate well.  11:50am:  Consent appt - Level 3 - Out pt. BMT Clinic.    04/13/22: Local COVID PCR swab:  I recommend your nearest [facility] or [facility].  ? I recommend you go to your nearest [facility], [facility], [facility], [facility] etc to purchase a rapid home COVID test.  These are usually $8-$10 and typically include 2 tests in each box.  On the date listed please complete the test according to the included instructions.  Once you have your results, please take a picture of that test result, and send that picture in an email to your donor coordinator at [email or web address] the same day that you complete the test.  Please let your donor coordinator know if you cannot purchase a home COVID test and we may be able to provide you with a test from our supply    04/16/22:  [facility]   [address] [place], [place], [place] [address]  5:00pm:  Stem cell mobilization injection #1- Level 3 - Out pt. BMT Clinic    04/17/22:  [facility] - [facility]   [address] [place], [place], [place] [address]  1:00pm:  Stem cell mobilization inj. #2 & COVID swab - Level 3 - Out pt. BMT Clinic    04/18/22:  [facility]   [address] [place], [place], [place] [address]  12:00pm:  Stem cell mobilization injection #3 - Level 3 - Out pt. BMT Clinic    04/19/22:  [facility]   [address] [place], [place], [place] [address]  3:30pm:  Stem cell mobilization injection #4 - Level 3 - Out pt. BMT Clinic    04/20/22:  [facility]   [address], [place], [place] [address]  7:00am:  Collection in Apheresis - see direction below:  ? Come in the main entrance of the hospital. Walk past the 6 pack of elevators. Turn Left. Walk down the hallway past the overhead sign [facility] and enter the door on the right next to the large BMT Apheresis sign. You will need to push the square silver button on the wall next to the door to enter.  Walk down the hall and enter the last door on the left marked BMT Apheresis.    DATE TBD:                [facility]  [address] [place], [place] [address]  TIME TBD:     Interventional Radiology (for Temp. [employer]) - 2nd floor Out Pt. Radiology                          ~ TBD Check In ~  ~ Interventional radiology staff will call you prior to date of procedure to advise regarding any medications that may need to be held, and any fasting requirements.  ~In most cases, this procedure is done in under 30 minutes using only local lidocaine injection for numbing. Upon request, sedation can be used if lidocaine is not sufficient. This must be discussed beforehand.  If it is planned for you to receive sedation, you will be given instructions regarding fasting prior to the appointment, and you will need to identify a support person to drive you home following collection.  ~Plan to be in radiology anywhere from 2-4 hours, due to post-operative recovery time.      RN notes for you:    ? Focus on good hydration starting now and continue following donation for a couple of weeks.  Increase iron in your diet now thru 1-2 months after donation to facilitate your own recovery. Minimize alcohol intake until donation is complete.  ? We ask that during the donation process you take all measures possible to avoid contracting a viral illness that may delay transplant.  This includes masking in indoor public spaces at all times, avoiding those who are ill, and washing hands or using hand sanitizer diligently.  ? Specific instructions for peripheral blood stem cell donors: Increase calcium in your diet or take TUMS on days of injections, NOT needed after donation.  For pain control on days of injections and day of collection - take Tylenol/Acetaminophen and add Claritin/Loratadine if needed.  We can prescribe something stronger if this is inadequate.  Avoid NSAID class drugs (Aspirin, Aleve/Naproxen, Ibuprofen/Motrin) starting on first day of injections thru the day following collection.   ? If you cannot keep your appointments, please call or email as soon as possible so we can reschedule.    ? If anything changes in your own health or availability, please call donor coordinator directly or the main BMT triage line to let us know as soon as possible.  ? If you have any questions or would like a map or driving direction to any of the locations, please don?t hesitate to reach out.    Thank you for your sacrifice and heroic efforts,   Rafael Bihari, BMT Registrar  c/o  [name], RN, BSN, [facility]  Blood and Marrow Transplant Donor Coordinator     [email or web address]  desk phone [phone] (landline, cannot receive texts)  BMT Clinic 24/7 triage line (after hours/weekend/holiday/emergency): [phone]  Office Specialists for scheduling needs:  [phone], [phone], or [phone]  fax (for leave paperwork) send Attn: [place] @ [phone]

## 2022-03-19 NOTE — Progress Notes
Donor Consent Hematologic Malignancies & Cellular [employer]    Name: Ricky Cordova   MRN: [id number]   DOB: 04-03-1961 Age: 61 y.o.     Date of Service: 03/19/2022     Reason for Visit: Evaluation for an allogeneic stem cell donation.    Related Donor for: Ricky, Cordova, MRN: [id number] Age: 56 Dx: AML  Recipient Team Color: blue, Recipient Pre-Transplant Coordinator: Shelleen/Abhyankar  Donor Relationship to Recipient: brother  Match Status: full match  Stem Cell Source Requested by Recipient Team:  PSC  Graft Manipulation:  Not Applicable      Subjective: [name] is a 61 y.o. male Doing well today. History of drug and alcohol abuse. Currently sober for 5-6 years.       Cancer Staging   No matching staging information was found for the patient.     History of Present Illness:    Medical History:   Diagnosis Date   ? Coronary artery disease    ? Gout    ? Hyperlipidemia    ? Hypertension    ? Neuropathy    ? Neuropathy    ? OSA on CPAP      Surgical History:   Procedure Laterality Date   ? TONSILLECTOMY  1968   ? BALLOON ANGIOPLASTY, ARTERY       Family History   Problem Relation Age of Onset   ? Thyroid Disease Mother    ? None Reported Father    ? None Reported Sister    ? Cancer Brother 38        Acute myeloid leukemia   ? Cancer Maternal Grandmother    ? Diabetes Maternal Grandfather    ? Hypertension Maternal Grandfather    ? Heart Failure Maternal Grandfather    ? Cancer Other    ? Hypertension Other    ? Thyroid Disease Other      Social History     Tobacco Use   ? Smoking status: Former     Packs/day: 2.00     Years: 10.00     Additional pack years: 0.00     Total pack years: 20.00     Types: Cigarettes     Quit date: 1990     Years since quitting: 33.8   ? Smokeless tobacco: Current     Types: Chew   Substance and Sexual Activity   ? Alcohol use: Not Currently     Comment: 15   ? Drug use: Yes     Types: Cocaine, Other-See Comments     Comment: Cocaine and speed x 4 years; quit 1991       Immunizations (includes history and patient reported):   Immunization History   Administered Date(s) Administered   ? COVID-19 [facility]), mRNA vacc, 100 mcg/0.5 mL (PF) 08/02/2019, 08/29/2019, 11/20/2020   ? COVID-19 (P[facility]ZER), mRNA vacc, 30 mcg/0.3 mL (PF) 04/05/2020   ? COVID-19 Bivalent (39YR+)(PFIZER), mRNA vacc, 32mcg/0.3mL 03/07/2021        Allergies:  Patient has no known allergies.    Medications:     Medication List          Accurate as of March 19, 2022  2:27 PM. If you have any questions, ask   your nurse or doctor.            CONTINUE taking these medications    ? allopurinoL 300 mg tablet; Commonly known as: ZYLOPRIM  ? amLODIPine 5 mg tablet; Commonly known as: NORVASC  ? aspirin  EC 81 mg tablet; Commonly known as: ASPIR-LOW  ? atorvastatin 20 mg tablet; Commonly known as: LIPITOR  ? benfotiamine 150 mg Cap  ? CLARITIN 10 mg tablet; Generic drug: loratadine  ? DIETARY SUPPLEMENT PO  ? docusate 100 mg capsule; Commonly known as: COLACE  ? doxazosin 2 mg tablet; Commonly known as: CARDURA  ? folic acid 400 mcg tablet; Commonly known as: FOLATE  ? hydroCHLOROthiazide 25 mg tablet; Commonly known as: HYDRODIURIL  ? metoprolol succinate XL 25 mg extended release tablet; Commonly known   as: TOPROL XL  ? olmesartan 20 mg tablet; Commonly known as: BENICAR  ? ONE-A-DAY MEN'S 50PLUS(GINKGO) 400-300-120 mcg-mcg-mg Tab; Generic drug:   mv-mins-folic-lycopene-ginkgo  ? PHILLIPS' COLON HEALTH PO  ? pregabalin 150 mg capsule; Commonly known as: LYRICA  ? PRILOSEC PO  ? venlafaxine 100 mg tablet; Commonly known as: EFFEXOR; TAKE 1 TABLET   DAILY. TAKE  WITH FOOD FOR NEUROPATHIC  PAIN.  ? VITAMIN D3 5000 unit tablet; Generic drug: CHOLEcalciferoL (vitamin D3)           Review of Systems:  Constitutional: no fevers and night sweats, No fatigue or weight loss  HENT: +Rhinorrhea with eating and showering  Eyes: Negative  CVS: No chest pain, edema, or palpitations  Respiratory: No shortness of breath, wheezing, cough,   Abdomen: No abdominal pain,   nausea, vomiting, diarrhea,    GU: No dysuria, or hematuria  MSK: No arthralgias, back pain, joint swelling   Dermatology: No pallor, rash, wound, or color changes  Neurology: No headaches, dizziness,   Hematologic: No bruising, swelling, or adenopathy  Psych: No behavior problem, confusion, mood changes       Physical Exam:  Vital Signs:  There were no vitals filed for this visit.     Weight:  Wt Readings from Last 3 Encounters:   03/10/22 (!) 169 kg (372 lb 9.6 oz)   03/21/20 (!) 152.4 kg (336 lb)   10/18/18 (!) 145.2 kg (320 lb)        Karnofsky Scale: 100% Normal, no complaints    Vital signs and nursing notes reviewed    General:  Morbidly obese, Alert, cooperative, no distress, appears stated age  Head:  Normocephalic, without obvious abnormality, atraumatic  Eyes:  Conjunctivae/corneas clear.  PERRL, EOMs intact.    Throat:  Lips, mucosa and tongue normal.  Teeth and gums normal. A few missing teeth.  Neck:  Supple, symmetrical, trachea midline, no adenopathy, thyroid: no enlargement/tenderness/nodules  Lungs:  Clear to auscultation bilaterally  Heart:    Regular rate and rhythm, S1, S2 normal, no murmur, click rub or gallop. Heart sounds diminished due to body habitus.  Abdomen:  Soft, non-tender.  Bowel sounds normal. Profound central obesity    Extremities:  Extremities normal, atraumatic, no cyanosis or edema  Skin:   Skin color, texture, turgor normal.  No rashes or lesions  Lymph nodes:  Cervical, supraclavicular and axillary nodes normal  Neurologic:  CNII - XII intact.  Normal strength, sensation throughout.  Musculoskeletal:  Normal / Negative  Psych:  Normal      Lab/Radiology/Other Diagnostic Tests:    CBC w diff    Lab Results   Component Value Date/Time    WBC 7.7 03/10/2022 09:15 AM    RBC 3.93 (L) 03/10/2022 09:15 AM    HGB 12.8 (L) 03/10/2022 09:15 AM    HCT 37.4 (L) 03/10/2022 09:15 AM    MCV 95.0 03/10/2022 09:15 AM    MCH 32.6  03/10/2022 09:15 AM    MCHC 34.3 03/10/2022 09:15 AM    RDW 14.2 03/10/2022 09:15 AM    PLTCT 236 03/10/2022 09:15 AM    MPV 7.9 03/10/2022 09:15 AM    Lab Results   Component Value Date/Time    NEUT 59 03/10/2022 09:15 AM    ANC 4.50 03/10/2022 09:15 AM    LYMA 28 03/10/2022 09:15 AM    ALC 2.10 03/10/2022 09:15 AM    MONA 10 03/10/2022 09:15 AM    AMC 0.80 03/10/2022 09:15 AM    EOSA 2 03/10/2022 09:15 AM    AEC 0.20 03/10/2022 09:15 AM    BASA 1 03/10/2022 09:15 AM    ABC 0.10 03/10/2022 09:15 AM        Comprehensive Metabolic Profile    Lab Results   Component Value Date/Time    NA 136 (L) 03/10/2022 09:15 AM    K 4.0 03/10/2022 09:15 AM    CL 102 03/10/2022 09:15 AM    CO2 26 03/10/2022 09:15 AM    GAP 8 03/10/2022 09:15 AM    BUN 22 03/10/2022 09:15 AM    CR 1.09 03/10/2022 09:15 AM    GLU 95 03/10/2022 09:15 AM    Lab Results   Component Value Date/Time    CA 9.2 03/10/2022 09:15 AM    PO4 3.7 03/10/2022 09:15 AM    ALBUMIN 3.9 03/10/2022 09:15 AM    TOTPROT 6.8 03/10/2022 09:15 AM    ALKPHOS 59 03/10/2022 09:15 AM    AST 35 03/10/2022 09:15 AM    ALT 44 03/10/2022 09:15 AM    TOTBILI 0.4 03/10/2022 09:15 AM          Radiology:  Pertinent radiology reviewed and summarized in assessment and plan    ------------------------------------------------------------------------------------------------------------------------------------------------                     Assessment/Plan:    Primary diagnosis:  PSC stem cell donor for brother.  I reviewed the pre-donation evaluation results with pt today  Related Donor for: Moultrie, Omary, MRN: [id number] Age: 7 Dx: AML  Recipient Team Color: blue, Recipient Pre-Transplant Coordinator: Shelleen/Abhyankar  Donor Relationship to Recipient: brother  Match Status: full match  Stem Cell Source Requested by Recipient Team:  [facility]  Graft Manipulation:  Not Applicable     Heme:    Mild anemia, RBC slightly low 3.93. Patient denies knowledge of being anemic.  B12 level normal. Iron studies normal. Discussed with Dr. Vivianne Spence. Will defer as donor and have pt see Dr. Vivianne Spence for further evaluation.   Monitor CBC for transfusion needs, goal Hgb>7 and platelets >10K.    FEN/Renal:    Renal function normal.   Electrolytes stable. Replace electrolytes per standard protocol.   There is no height or weight on file to calculate BMI.    Endo: NA HbA1C last checked in 2020. Blood sugar this AM 95, has eaten today.    ID:   No active infections.    Pulm: NA     CV:  EKG: SR. ATc [place] 414.    GI:   No nausea, vomiting, diarrhea, or constipation.   Liver function normal. No hepatosplenomegaly noted.    Psych:   Monitor and offer support as needed.     Time, on the same day as visit, 20 min chart review in clinic today, labs, pertinent imaging, prior provider note reviewed, 20 min with pt face to face performing ROS, PE and discussing  current status and assessment and plan, 10 minutes post visit management in clinic today, discussed with Dr. Vivianne Spence.  Total time = 50 min.      Teresa Coombs, DO

## 2022-03-22 ENCOUNTER — Encounter: Admit: 2022-03-22 | Discharge: 2022-03-22 | Payer: BC Managed Care – PPO

## 2022-03-22 ENCOUNTER — Encounter: Admit: 2022-03-22 | Discharge: 2022-03-22 | Payer: Private Health Insurance - Indemnity

## 2022-03-22 DIAGNOSIS — I2510 Atherosclerotic heart disease of native coronary artery without angina pectoris: Secondary | ICD-10-CM

## 2022-03-22 DIAGNOSIS — I10 Essential (primary) hypertension: Secondary | ICD-10-CM

## 2022-03-22 DIAGNOSIS — Z52001 Unspecified donor, stem cells: Secondary | ICD-10-CM

## 2022-03-22 DIAGNOSIS — G4733 Obstructive sleep apnea (adult) (pediatric): Secondary | ICD-10-CM

## 2022-03-22 DIAGNOSIS — Z01812 Encounter for preprocedural laboratory examination: Secondary | ICD-10-CM

## 2022-03-22 DIAGNOSIS — M109 Gout, unspecified: Secondary | ICD-10-CM

## 2022-03-22 DIAGNOSIS — E785 Hyperlipidemia, unspecified: Secondary | ICD-10-CM

## 2022-03-22 DIAGNOSIS — D6489 Other specified anemias: Secondary | ICD-10-CM

## 2022-03-22 DIAGNOSIS — G629 Polyneuropathy, unspecified: Secondary | ICD-10-CM

## 2022-03-22 LAB — COVID-19 (SARS-COV-2) PCR

## 2022-03-22 NOTE — Progress Notes
RELATED DONOR PERIPHERAL BLOOD STEM CELL DONATION CONSENT    Ricky Cordova         [id number]  16-Jun-1960 60 y.o.    Related Donor for: Yaron, Grasse, MRN: [id number] Age: 40 Dx: AML  Recipient Team Color: blue, Recipient Pre-Transplant Coordinator: Shelleen/Abhyankar  Donor Relationship to Recipient: brother  Match Status: full match  Stem Cell Source Requested by Recipient Team:  [facility]  Graft Manipulation:  Not Applicable    Dr. Nicholes Mango would like to refer the donor to Dr. Kristian Covey d/t his hematology lab results that show anemia since at least 2020.  Referral placed and donor aware to await a call from his nurses for scheduling.    Scheduling as follows will be CANCELLED:   11/14 Home COVID swab (48-72 hrs prior to recipient beginning prep if fresh infusion planned)  11/17 MOB start  11/19 COVID swab in treatment during mobilization (48-72 hrs prior to collection)  11/21 PBSC collection  11/21 Temp HD line scheduled for backup only on day of collection    See Health History questionnaire for vaccination, travel and recent blood transfusion history.      Declaration of Urgent Medical Need:    Eligibility Status of Donor Reason for Ineligibility       []    Not Applicable / eligible    [x]     DUMN form required / ineligible (Check box on right) []    International Donor    [x]    Test results or health history questionnaire    []    Testing ineligible within 30 days of collection (7 days for DLI)    []    Testing eligibility status pending review of Infectious disease markers (IDM)

## 2022-03-23 ENCOUNTER — Encounter: Admit: 2022-03-23 | Discharge: 2022-03-23 | Payer: BC Managed Care – PPO

## 2022-03-23 NOTE — Progress Notes
Appointment Notes:    Future Appointments   Date Time Provider [place]   04/06/2022  1:00 PM Kribs, Marily Lente, MD [id number] [place] Exam     Referring Provider:  Rogue Jury, DO     Records are in O2.     Contact Summary:  Patient is a 61 year old male who was seen in clinic by Dr. Nicholes Mango on 03/22/22 as work-up to be a stem cell donor for his brother. Labs were drawn on 03/10/22 showing his Hgb to be 12.8 and Hct 37.4 with normal iron panel. He is now being referred to Dr. Kristian Covey for evaluation of anemia.     Allergies reviewed and verified with the patient, and documented in Epic:  Yes

## 2022-04-06 ENCOUNTER — Encounter: Admit: 2022-04-06 | Discharge: 2022-04-06 | Payer: BC Managed Care – PPO

## 2022-04-06 DIAGNOSIS — I10 Essential (primary) hypertension: Secondary | ICD-10-CM

## 2022-04-06 DIAGNOSIS — G629 Polyneuropathy, unspecified: Secondary | ICD-10-CM

## 2022-04-06 DIAGNOSIS — D649 Anemia, unspecified: Secondary | ICD-10-CM

## 2022-04-06 DIAGNOSIS — M549 Dorsalgia, unspecified: Secondary | ICD-10-CM

## 2022-04-06 DIAGNOSIS — E785 Hyperlipidemia, unspecified: Secondary | ICD-10-CM

## 2022-04-06 DIAGNOSIS — I2510 Atherosclerotic heart disease of native coronary artery without angina pectoris: Secondary | ICD-10-CM

## 2022-04-06 DIAGNOSIS — M109 Gout, unspecified: Secondary | ICD-10-CM

## 2022-04-06 DIAGNOSIS — G4733 Obstructive sleep apnea (adult) (pediatric): Secondary | ICD-10-CM

## 2022-04-06 NOTE — Progress Notes
Name: Ricky Cordova            Date of Birth: [birth year]  Medical Record Number: [id number]        Date of Service: 04/06/2022  Referring provider: Dr. Durenda Guthrie    Reason for visit: Anemia, New Patient      History of present illness: Ricky Cordova is a 61 y.o. male referred for evaluation of anemia.  This was discovered incidentally during evaluation performed to assess eligibility to donate stem cells for his brother.  He has never previously been told he was anemic, or had any other hematologic disorder.  He notes mild, chronic fatigue.  He has a history of lumbosacral radiculopathy.  He is not having any bleeding. He does not donate blood.  He otherwise feels well and does not have any concerns or complaints.    Medical and surgical history include gout, lumbosacral radiculopathy, hypertension, gastroesophageal reflux disease, BMI greater than 45    Family history: His brother has AML.  No other known family history of hematologic disorder    Social history: He is married.  He lives near [place].  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  He is a former smoker.    Laboratory Results  09/26/2013 serum immunofixation electrophoresis negative  10/13/2018 White blood cell count 14.16, hemoglobin 13.3, hematocrit 39, platelet count 248,000  10/14/2018 White blood cell count 6.8, hemoglobin 12.5, hematocrit 38, platelet count 223,000  10/18/2018 serum immunofixation electrophoresis no paraprotein seen  10/18/2018 vitamin B12 401  10/18/2018 vitamin B1 168  10/18/2018 methylmalonic acid 0.2    Recent Results (from the past 1344 hour(s))   HLA BUCCAL CLASS I & CLASS II, HIGH RESOLUTION TYPING BY NGS    Collection Time: 02/15/22 12:00 PM   Result Value Ref Range    HLA Buccal Class I & Class II, High Resolution Typing by NGS       Report Available in Epic  TEST PERFORMED BY [facility] [employer]     TRANSFERRIN    Collection Time: 03/10/22  9:15 AM   Result Value Ref Range    Transferrin 233 185 - 336 MG/DL   RETICULOCYTE COUNT    Collection Time: 03/10/22  9:15 AM   Result Value Ref Range    Retic, Uncorrected 1.3 0.5 - 2.0 %    Retic, Corrected 1.0 %    Retic, Absolute 51.1 30 - 94 K/UL   PERIPHERAL SMEAR    Collection Time: 03/10/22  9:15 AM   Result Value Ref Range    Peripheral Smear       MILD NORMOCHROMIC NORMOCYTIC ANEMIA. WBCS AND PLATELETS ARE NORMAL IN NUMBER AND   MORPHOLOGY.      Pathologist Signature       INTERPRETED BY Merla Riches MD  By the [facility] SIGNATURE ABOVE, I attest that I have personally formulated the   final interpretation expressed in this report and that the above diagnosis is   based upon my examination of the slides and/or other material indicated in this   report.     IRON + BINDING CAPACITY + %SAT+ FERRITIN    Collection Time: 03/10/22  9:15 AM   Result Value Ref Range    Iron 100 50 - 185 MCG/DL    Iron Binding-TIBC 454 270 - 380 MCG/DL    % Saturation 29 28 - 42 %    Ferritin 141 30 - 300 NG/ML  ELECTROPHORESIS-HEMOGLOBIN    Collection Time: 03/10/22  9:15 AM   Result Value Ref Range    Hgb Elect Interpretation NORMAL HEMOGLOBIN ELECTROPHORESIS     Hgb A 97.0 94.5 - 98.5 %    Hgb A2 3.0 1.5 - 3.5 %    Pathologist Signature       INTERPRETED BY LONG ZHENG M.D.  By the PATH SIGNATURE ABOVE, I attest that I have personally formulated the   final interpretation expressed in this report and that the above diagnosis is   based upon my examination of the slides and/or other material indicated in this   report.     PTT (APTT)    Collection Time: 03/10/22  9:15 AM   Result Value Ref Range    APTT 33.8 24.0 - 36.5 SEC   PROTIME INR (PT)    Collection Time: 03/10/22  9:15 AM   Result Value Ref Range    Protime 11.5 9.5 - 14.2 SEC    INR 1.0 0.8 - 1.2   PHOSPHORUS    Collection Time: 03/10/22  9:15 AM   Result Value Ref Range    Phosphorus 3.7 2.0 - 4.5 MG/DL   MAGNESIUM    Collection Time: 03/10/22  9:15 AM   Result Value Ref Range    Magnesium 1.7 1.6 - 2.6 mg/dL   LDH-LACTATE DEHYDROGENASE    Collection Time: 03/10/22  9:15 AM   Result Value Ref Range    Lactate Dehydrogenase 194 100 - 210 U/L   HLA BLOOD CLASS I & CLASS II, HIGH RESOLUTION TYPING BY NGS    Collection Time: 03/10/22  9:15 AM   Result Value Ref Range    HLA Blood Class I & Class II, High Resolution Typing by NGS       Report Available in Epic  TEST PERFORMED BY [facility] [facility]     DONOR/RECIPIENT VIRAL-BMT    Collection Time: 03/10/22  9:15 AM   Result Value Ref Range    HBsAg NONREACTIVE NR-NONREACTIVE    Anti HBc Total NONREACTIVE NR-NONREACTIVE    Anti HCV NONREACTIVE NR-NONREACTIVE    Anti HIV I and II plus O EIA NONREACTIVE NR-NONREACTIVE    HTLV I and II NONREACTIVE NR-NONREACTIVE    Syphilis MHA-TP NONREACTIVE NR-NONREACTIVE    HIV Nucleic Acid NEG NEG-NEG    HCV Nucleic Acid NEG NEG-NEG    West Nile Virus NAT NEG NEG-NEG    Anti-T. cruzi ([place]) NONREACTIVE NR-NONREACTIVE    Hep B Virus Nat NEG NEG-NEG   COMPREHENSIVE METABOLIC PANEL    Collection Time: 03/10/22  9:15 AM   Result Value Ref Range    Sodium 136 (L) 137 - 147 MMOL/L    Potassium 4.0 3.5 - 5.1 MMOL/L    Chloride 102 98 - 110 MMOL/L    Glucose 95 70 - 100 MG/DL    Blood Urea Nitrogen 22 7 - 25 MG/DL    Creatinine 1.61 0.4 - 1.24 MG/DL    Calcium 9.2 8.5 - 09.6 MG/DL    Total Protein 6.8 6.0 - 8.0 G/DL    Total Bilirubin 0.4 0.3 - 1.2 MG/DL    Albumin 3.9 3.5 - 5.0 G/DL    Alk Phosphatase 59 25 - 110 U/L    AST (SGOT) 35 7 - 40 U/L    CO2 26 21 - 30 MMOL/L    ALT (SGPT) 44 7 - 56 U/L    Anion Gap 8 3 - 12    eGFR >60 >  60 mL/min   CMV AB IGG    Collection Time: 03/10/22  9:15 AM   Result Value Ref Range    CMV, IgG NEG    CBC AND DIFF    Collection Time: 03/10/22  9:15 AM   Result Value Ref Range    White Blood Cells 7.7 4.5 - 11.0 K/UL    RBC 3.93 (L) 4.4 - 5.5 M/UL    Hemoglobin 12.8 (L) 13.5 - 16.5 GM/DL    Hematocrit 16.1 (L) 40 - 50 %    MCV 95.0 80 - 100 FL    MCH 32.6 26 - 34 PG MCHC 34.3 32.0 - 36.0 G/DL    RDW 09.6 11 - 15 %    Platelet Count 236 150 - 400 K/UL    MPV 7.9 7 - 11 FL    Neutrophils 59 41 - 77 %    Lymphocytes 28 24 - 44 %    Monocytes 10 4 - 12 %    Eosinophils 2 0 - 5 %    Basophils 1 0 - 2 %    Absolute Neutrophil Count 4.50 1.8 - 7.0 K/UL    Absolute Lymph Count 2.10 1.0 - 4.8 K/UL    Absolute Monocyte Count 0.80 0 - 0.80 K/UL    Absolute Eosinophil Count 0.20 0 - 0.45 K/UL    Absolute Basophil Count 0.10 0 - 0.20 K/UL   ANTIBODY SCREEN    Collection Time: 03/10/22  9:15 AM   Result Value Ref Range    Antibody Screen NEG    ABO/RH(D)    Collection Time: 03/10/22  9:15 AM   Result Value Ref Range    ABO/RH(D) A POS    COVID-19 (SARS-COV-2) PCR    Collection Time: 03/10/22  9:17 AM    Specimen: Nasopharyngeal; Flocked Swab   Result Value Ref Range    COVID-19 (SARS-CoV-2) PCR Source FLOCKED SWAB  NASOPHARYNGEAL       COVID-19 (SARS-CoV-2) PCR NOT DETECTED DN-NOT DETECTED   ECG 12-LEAD    Collection Time: 03/10/22  9:57 AM   Result Value Ref Range    VENTRICULAR RATE 64 BPM    P-R INTERVAL 182 ms    QRS DURATION 96 ms    Q-T INTERVAL 402 ms    QTC CALCULATION (BAZETT) 414 ms    P AXIS -2 degrees    R AXIS -11 degrees    T AXIS 12 degrees   URINALYSIS MICROSCOPIC REFLEX TO CULTURE    Collection Time: 03/10/22 10:45 AM    Specimen: Midstream; Urine   Result Value Ref Range    WBCs,UA 0-2 0 - 2 /HPF    RBCs,UA NONE 0 - 3 /HPF    Comment,UA       Criteria for reflex to culture are WBC>10, Positive Nitrite, and/or >=+1   leukocytes. If quantity is not sufficient, an addendum will follow.      UA Reflex Specimen Type and Source URINE  MIDSTREAM       MucousUA TRACE    URINALYSIS DIPSTICK REFLEX TO CULTURE    Collection Time: 03/10/22 10:45 AM    Specimen: Urine   Result Value Ref Range    Color,UA YELLOW     Turbidity,UA CLEAR CLEAR-CLEAR    Specific Gravity-Urine 1.013 1.005 - 1.030    pH,UA 6.0 5.0 - 8.0    Protein,UA NEG NEG-NEG    Glucose,UA NEG NEG-NEG    Ketones,UA NEG NEG-NEG    Bilirubin,UA NEG NEG-NEG  Blood,UA NEG NEG-NEG    Urobilinogen,UA NORMAL NORM-NORMAL    Nitrite,UA NEG NEG-NEG    Leukocytes,UA NEG NEG-NEG    Urine Ascorbic Acid, UA NEG NEG-NEG   DONOR/RECIPIENT VIRAL-BMT    Collection Time: 03/22/22 12:07 PM   Result Value Ref Range    HBsAg NONREACTIVE NR-NONREACTIVE    Anti HBc Total NONREACTIVE NR-NONREACTIVE    Anti HCV NONREACTIVE NR-NONREACTIVE    Anti HIV I and II plus O EIA NONREACTIVE NR-NONREACTIVE    HTLV I and II NONREACTIVE NR-NONREACTIVE    Syphilis MHA-TP NONREACTIVE NR-NONREACTIVE    HIV Nucleic Acid NEG NEG-NEG    HCV Nucleic Acid NEG NEG-NEG    West Nile Virus NAT NEG NEG-NEG    Anti-T. cruzi ([place]) NONREACTIVE NR-NONREACTIVE    Hep B Virus Nat NEG NEG-NEG   COVID-19 (SARS-COV-2) PCR    Collection Time: 03/22/22 12:10 PM    Specimen: Nasopharyngeal; Flocked Swab   Result Value Ref Range    COVID-19 (SARS-CoV-2) PCR Source FLOCKED SWAB  NASOPHARYNGEAL       COVID-19 (SARS-CoV-2) PCR NOT DETECTED DN-NOT DETECTED       Patient Active Problem List    Diagnosis Date Noted   ? Donor of stem cell 03/10/2022   ? Morbid obesity (HCC) 03/10/2022   ? Other specified anemias 03/10/2022   ? Primary hypertension 03/10/2022   ? Annular tear of lumbar disc 03/13/2018   ? Lumbosacral radiculopathy 03/13/2018   ? Neuropathy 09/26/2013       Medical History:   Diagnosis Date   ? Back pain    ? Coronary artery disease    ? Gout    ? Hyperlipidemia    ? Hypertension    ? Neuropathy    ? Neuropathy    ? OSA on CPAP      Surgical History:   Procedure Laterality Date   ? TONSILLECTOMY  1968   ? BALLOON ANGIOPLASTY, ARTERY     ? COLONOSCOPY  2019   ? ESOPHAGUS SURGERY      Tonsills     Family History   Problem Relation Age of Onset   ? Cancer Mother    ? Thyroid Disease Mother    ? None Reported Father    ? None Reported Sister    ? Cancer Brother 88        Acute myeloid leukemia   ? Cancer Maternal Grandmother    ? Diabetes Maternal Grandfather    ? Hypertension Maternal Grandfather    ? Heart Failure Maternal Grandfather    ? Cancer Other    ? Hypertension Other    ? Thyroid Disease Other      Social History     Socioeconomic History   ? Marital status: Married   Tobacco Use   ? Smoking status: Former     Packs/day: 2.00     Years: 10.00     Additional pack years: 0.00     Total pack years: 20.00     Types: Cigarettes     Quit date: 05/31/1988     Years since quitting: 33.8   ? Smokeless tobacco: Current     Types: Chew   Substance and Sexual Activity   ? Alcohol use: Not Currently     Comment: 15   ? Drug use: Yes     Types: Cocaine     Comment: Not currently   ? Sexual activity: Yes     Partners: Female  Birth control/protection: None       Current Medications:  ? allopurinoL (ZYLOPRIM) 300 mg tablet Take one tablet by mouth daily. Take with food.   ? amLODIPine (NORVASC) 5 mg tablet Take one tablet by mouth daily.   ? aspirin EC 81 mg tablet Take one tablet by mouth daily. Take with food.   ? atorvastatin (LIPITOR) 20 mg tablet Take one tablet by mouth daily.   ? benfotiamine 150 mg cap Take 300 mg by mouth daily.   ? cholecalciferol (VITAMIN D-3) 125 mcg (5,000 unit) tablet Take one tablet by mouth daily.   ? DIETARY SUPPLEMENT PO Take 150 mg by mouth daily. lumbrokinase   ? docusate (COLACE) 100 mg capsule Take one capsule by mouth six times weekly.   ? doxazosin (CARDURA) 2 mg tablet    ? folic acid 400 mcg tablet Take one tablet by mouth daily.   ? hydroCHLOROthiazide (HYDRODIURIL) 25 mg tablet    ? L GASSERI/B BIFIDUM/B LONGUM ([facility] PO) Take  by mouth daily.   ? loratadine (CLARITIN) 10 mg tablet Take one tablet by mouth every morning.   ? metoprolol XL (TOPROL XL) 25 mg extended release tablet Take one tablet by mouth daily.   ? mv-mins-folic-lycopene-ginkgo (ONE-A-DAY MEN 50 PLUS (GINKGO)) 400-300-120 mcg-mcg-mg tab Take  by mouth.   ? olmesartan (BENICAR) 20 mg tablet Take one tablet by mouth daily.   ? OMEPRAZOLE (PRILOSEC PO) Take  by mouth.   ? pregabalin (LYRICA) 150 mg capsule Take one capsule by mouth three times daily.   ? venlafaxine (EFFEXOR) 100 mg tablet TAKE 1 TABLET DAILY. TAKE  WITH FOOD FOR NEUROPATHIC  PAIN.       Vitals:    04/06/22 1247   BP: 132/68   BP Source: Arm, Left Upper   Pulse: 72   Resp: 20   SpO2: 96%   PainSc: Zero   Weight: (!) 171.6 kg (378 lb 3.2 oz)   Height: 184.6 cm (6' 0.68)       Physical Exam  Constitutional:       General: He is not in acute distress.     Appearance: He is not ill-appearing.   Eyes:      General: No scleral icterus.     Conjunctiva/sclera: Conjunctivae normal.   Cardiovascular:      Rate and Rhythm: Normal rate.   Pulmonary:      Effort: Pulmonary effort is normal. No respiratory distress.   Musculoskeletal:      Right lower leg: No edema.      Left lower leg: No edema.   Neurological:      Mental Status: He is alert.   Psychiatric:         Mood and Affect: Mood normal.         Behavior: Behavior normal.         Thought Content: Thought content normal.         Judgment: Judgment normal.         Assessment and Recommendations:  1.  Anemia  -We reviewed the prior laboratory results.  Evaluation has demonstrated no evidence of vitamin deficiency or hemolysis and the reticulocyte count is normal.  We discussed potential causes of anemia including liver disease, vitamin deficiency, hematologic disorders including hematologic neoplasm, among others.  We discussed that further laboratory evaluation could be performed and I would suggest testing for TSH, chronic viral infection, testosterone level, copper, SPEP, serum immunofixation electrophoresis, serum free light chains  -  We discussed other evaluation that could be undertaken including imaging and performing a bone marrow biopsy should there be persistent cytopenias without explanation  -After discussion and review of the laboratory testing he did not wish to pursue further evaluation at this time and elected to monitor the blood counts serially with his primary care provider.  He would consider further evaluation should there be worsening anemia or if other concerning changes develop    Return to clinic:  We will not schedule a return visit at this time, but he is welcome to return to hematology clinic as needed in the future      Lucianne Lei, MD    Time spent on the same day of the visit including preparing to see the patient; obtaining and/or reviewing separately obtained history; performing a medically appropriate examination and/or evaluation; counseling and educating the patient/family/caregiver; ordering medications, tests, or procedures; referring and communication with other health care professionals; documenting clinical information in the electronic health record; independently interpreting results and communicating results to the patient/family/caregiver; and care coordination was more than 40 minutes.

## 2022-04-20 ENCOUNTER — Encounter: Admit: 2022-04-20 | Discharge: 2022-04-20 | Payer: BC Managed Care – PPO

## 2022-09-16 ENCOUNTER — Encounter: Admit: 2022-09-16 | Discharge: 2022-09-16 | Payer: BC Managed Care – PPO

## 2024-06-02 ENCOUNTER — Encounter: Admit: 2024-06-02 | Discharge: 2024-06-02 | Payer: PRIVATE HEALTH INSURANCE

## 2024-06-02 DIAGNOSIS — T300 Burn of unspecified body region, unspecified degree: Principal | ICD-10-CM
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
## 2024-06-04 ENCOUNTER — Encounter: Admit: 2024-06-04 | Discharge: 2024-06-04 | Payer: PRIVATE HEALTH INSURANCE

## 2024-06-05 ENCOUNTER — Encounter: Admit: 2024-06-05 | Discharge: 2024-06-05 | Payer: PRIVATE HEALTH INSURANCE
